# Patient Record
Sex: Male | Born: 1960 | Race: Black or African American | Hispanic: No | Marital: Single | Smoking: Current every day smoker
Health system: Southern US, Community
[De-identification: ages and names within clinical notes are randomized; demographics above are authoritative.]

## PROBLEM LIST (undated history)

## (undated) DIAGNOSIS — M7730 Calcaneal spur, unspecified foot: Secondary | ICD-10-CM

## (undated) DIAGNOSIS — F32A Depression, unspecified: Secondary | ICD-10-CM

## (undated) DIAGNOSIS — M549 Dorsalgia, unspecified: Secondary | ICD-10-CM

## (undated) DIAGNOSIS — F319 Bipolar disorder, unspecified: Secondary | ICD-10-CM

## (undated) DIAGNOSIS — F329 Major depressive disorder, single episode, unspecified: Secondary | ICD-10-CM

## (undated) DIAGNOSIS — S83519A Sprain of anterior cruciate ligament of unspecified knee, initial encounter: Secondary | ICD-10-CM

## (undated) DIAGNOSIS — M1990 Unspecified osteoarthritis, unspecified site: Secondary | ICD-10-CM

## (undated) HISTORY — DX: Major depressive disorder, single episode, unspecified: F32.9

## (undated) HISTORY — PX: OTHER SURGICAL HISTORY: SHX169

## (undated) HISTORY — DX: Calcaneal spur, unspecified foot: M77.30

## (undated) HISTORY — DX: Sprain of anterior cruciate ligament of unspecified knee, initial encounter: S83.519A

## (undated) HISTORY — DX: Bipolar disorder, unspecified: F31.9

## (undated) HISTORY — DX: Depression, unspecified: F32.A

---

## 1999-10-18 ENCOUNTER — Encounter: Payer: Self-pay | Admitting: Neurosurgery

## 1999-10-18 ENCOUNTER — Ambulatory Visit (HOSPITAL_COMMUNITY): Admission: RE | Admit: 1999-10-18 | Discharge: 1999-10-18 | Payer: Self-pay | Admitting: Neurosurgery

## 2001-06-12 ENCOUNTER — Ambulatory Visit (HOSPITAL_COMMUNITY): Admission: RE | Admit: 2001-06-12 | Discharge: 2001-06-12 | Payer: Self-pay | Admitting: Pulmonary Disease

## 2001-06-12 ENCOUNTER — Encounter: Payer: Self-pay | Admitting: Unknown Physician Specialty

## 2002-06-27 ENCOUNTER — Emergency Department (HOSPITAL_COMMUNITY): Admission: EM | Admit: 2002-06-27 | Discharge: 2002-06-27 | Payer: Self-pay | Admitting: Emergency Medicine

## 2002-07-14 ENCOUNTER — Encounter: Payer: Self-pay | Admitting: Emergency Medicine

## 2002-07-14 ENCOUNTER — Emergency Department (HOSPITAL_COMMUNITY): Admission: EM | Admit: 2002-07-14 | Discharge: 2002-07-14 | Payer: Self-pay | Admitting: Emergency Medicine

## 2002-07-19 ENCOUNTER — Emergency Department (HOSPITAL_COMMUNITY): Admission: EM | Admit: 2002-07-19 | Discharge: 2002-07-19 | Payer: Self-pay | Admitting: *Deleted

## 2002-07-19 ENCOUNTER — Encounter: Payer: Self-pay | Admitting: *Deleted

## 2002-11-17 ENCOUNTER — Emergency Department (HOSPITAL_COMMUNITY): Admission: EM | Admit: 2002-11-17 | Discharge: 2002-11-17 | Payer: Self-pay | Admitting: Emergency Medicine

## 2003-03-18 ENCOUNTER — Emergency Department (HOSPITAL_COMMUNITY): Admission: EM | Admit: 2003-03-18 | Discharge: 2003-03-18 | Payer: Self-pay | Admitting: Emergency Medicine

## 2003-08-05 ENCOUNTER — Emergency Department (HOSPITAL_COMMUNITY): Admission: EM | Admit: 2003-08-05 | Discharge: 2003-08-05 | Payer: Self-pay | Admitting: Emergency Medicine

## 2003-09-23 ENCOUNTER — Emergency Department (HOSPITAL_COMMUNITY): Admission: EM | Admit: 2003-09-23 | Discharge: 2003-09-23 | Payer: Self-pay | Admitting: Emergency Medicine

## 2004-03-11 HISTORY — PX: BACK SURGERY: SHX140

## 2004-07-26 ENCOUNTER — Ambulatory Visit (HOSPITAL_COMMUNITY): Admission: RE | Admit: 2004-07-26 | Discharge: 2004-07-26 | Payer: Self-pay | Admitting: Neurosurgery

## 2004-10-03 ENCOUNTER — Encounter: Admission: RE | Admit: 2004-10-03 | Discharge: 2004-10-03 | Payer: Self-pay | Admitting: Neurosurgery

## 2004-10-24 ENCOUNTER — Encounter: Admission: RE | Admit: 2004-10-24 | Discharge: 2004-10-24 | Payer: Self-pay | Admitting: Neurosurgery

## 2004-11-29 ENCOUNTER — Ambulatory Visit (HOSPITAL_COMMUNITY): Admission: RE | Admit: 2004-11-29 | Discharge: 2004-11-29 | Payer: Self-pay | Admitting: Neurosurgery

## 2004-12-18 ENCOUNTER — Ambulatory Visit: Admission: RE | Admit: 2004-12-18 | Discharge: 2004-12-18 | Payer: Self-pay | Admitting: Neurosurgery

## 2005-01-04 ENCOUNTER — Inpatient Hospital Stay (HOSPITAL_COMMUNITY): Admission: RE | Admit: 2005-01-04 | Discharge: 2005-01-07 | Payer: Self-pay | Admitting: Neurosurgery

## 2005-03-19 ENCOUNTER — Encounter (HOSPITAL_COMMUNITY): Admission: RE | Admit: 2005-03-19 | Discharge: 2005-04-18 | Payer: Self-pay | Admitting: Neurosurgery

## 2005-04-19 ENCOUNTER — Emergency Department (HOSPITAL_COMMUNITY): Admission: EM | Admit: 2005-04-19 | Discharge: 2005-04-19 | Payer: Self-pay | Admitting: Emergency Medicine

## 2005-05-28 ENCOUNTER — Emergency Department (HOSPITAL_COMMUNITY): Admission: EM | Admit: 2005-05-28 | Discharge: 2005-05-28 | Payer: Self-pay | Admitting: Emergency Medicine

## 2005-08-05 ENCOUNTER — Emergency Department (HOSPITAL_COMMUNITY): Admission: EM | Admit: 2005-08-05 | Discharge: 2005-08-05 | Payer: Self-pay | Admitting: Emergency Medicine

## 2006-01-17 ENCOUNTER — Emergency Department (HOSPITAL_COMMUNITY): Admission: EM | Admit: 2006-01-17 | Discharge: 2006-01-17 | Payer: Self-pay | Admitting: Emergency Medicine

## 2006-06-09 ENCOUNTER — Emergency Department (HOSPITAL_COMMUNITY): Admission: EM | Admit: 2006-06-09 | Discharge: 2006-06-09 | Payer: Self-pay | Admitting: Emergency Medicine

## 2006-08-28 ENCOUNTER — Ambulatory Visit: Payer: Self-pay | Admitting: Orthopedic Surgery

## 2006-09-16 IMAGING — CR DG LUMBAR SPINE 2-3V
2 series · 2 of 2 positions shown · non-contrast
Comparison: AP and lateral views obtained on the same date.

CLINICAL DATA: Low back pain.

LUMBAR SPINE - 2 VIEW

[view not recorded (1 of 2)]
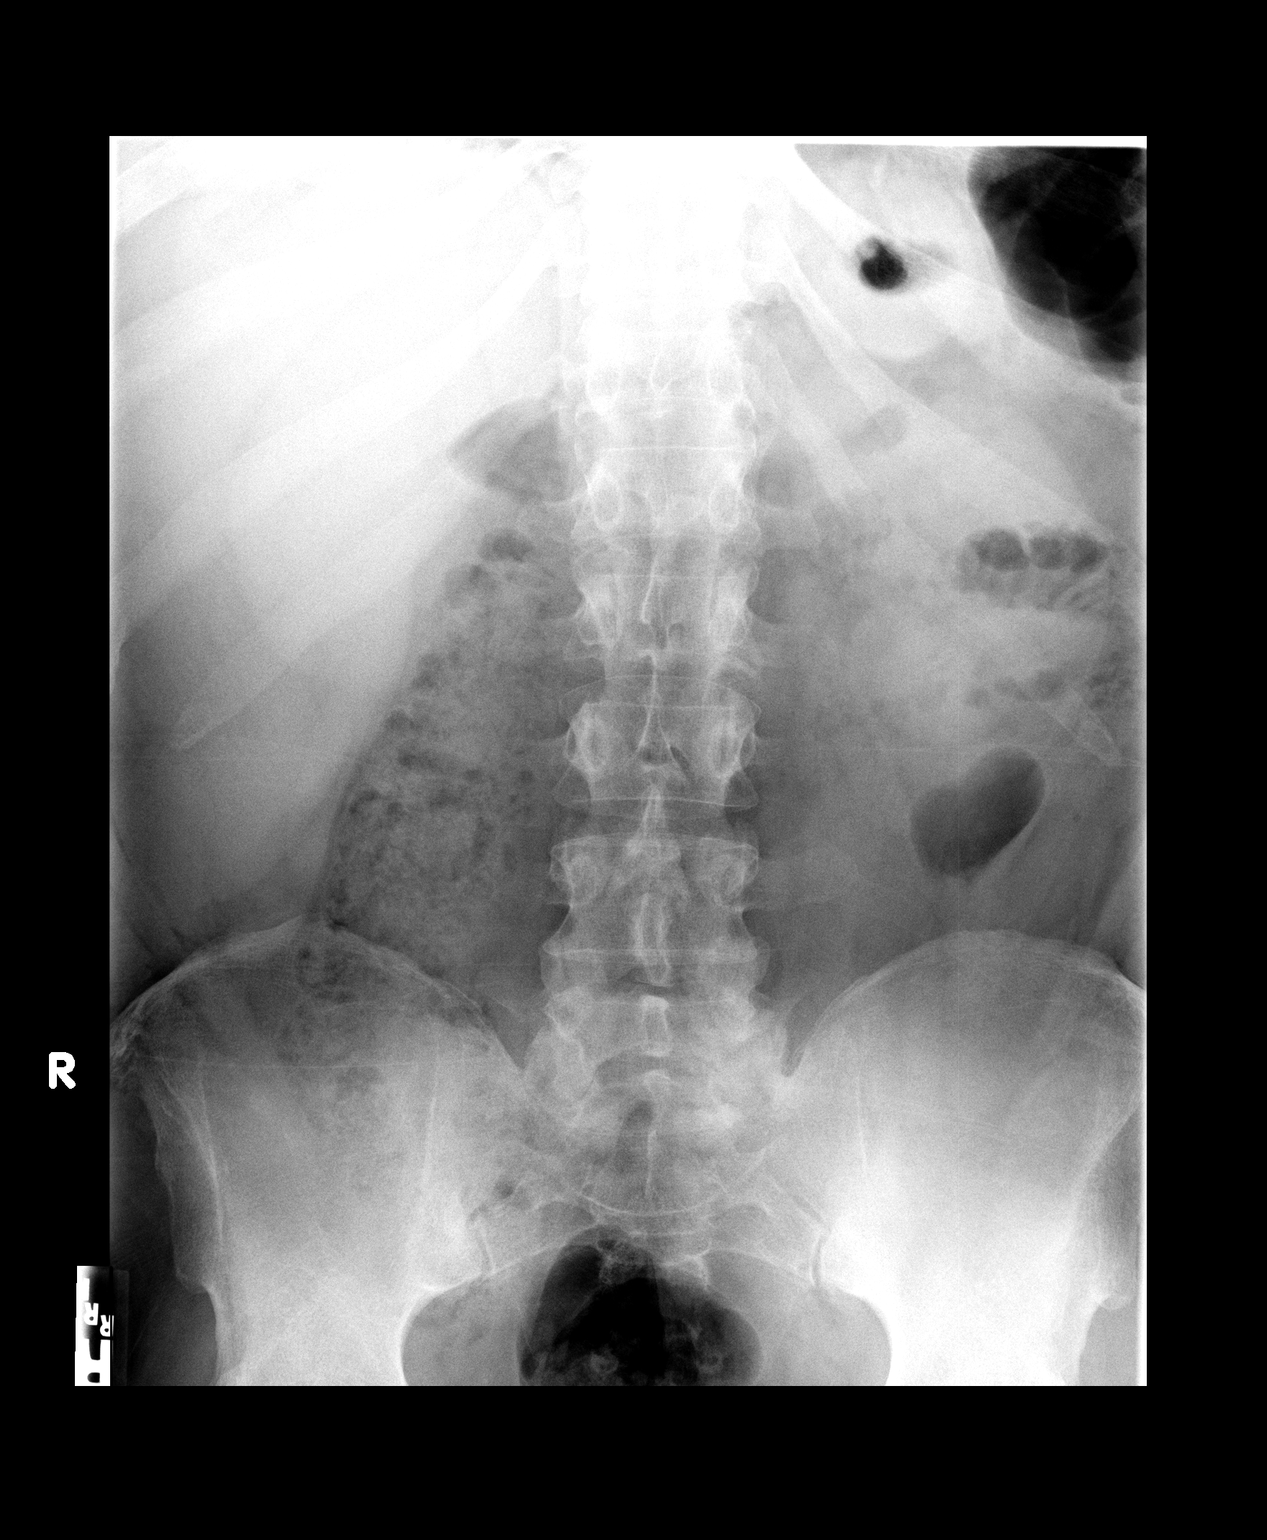

[view not recorded (2 of 2)]
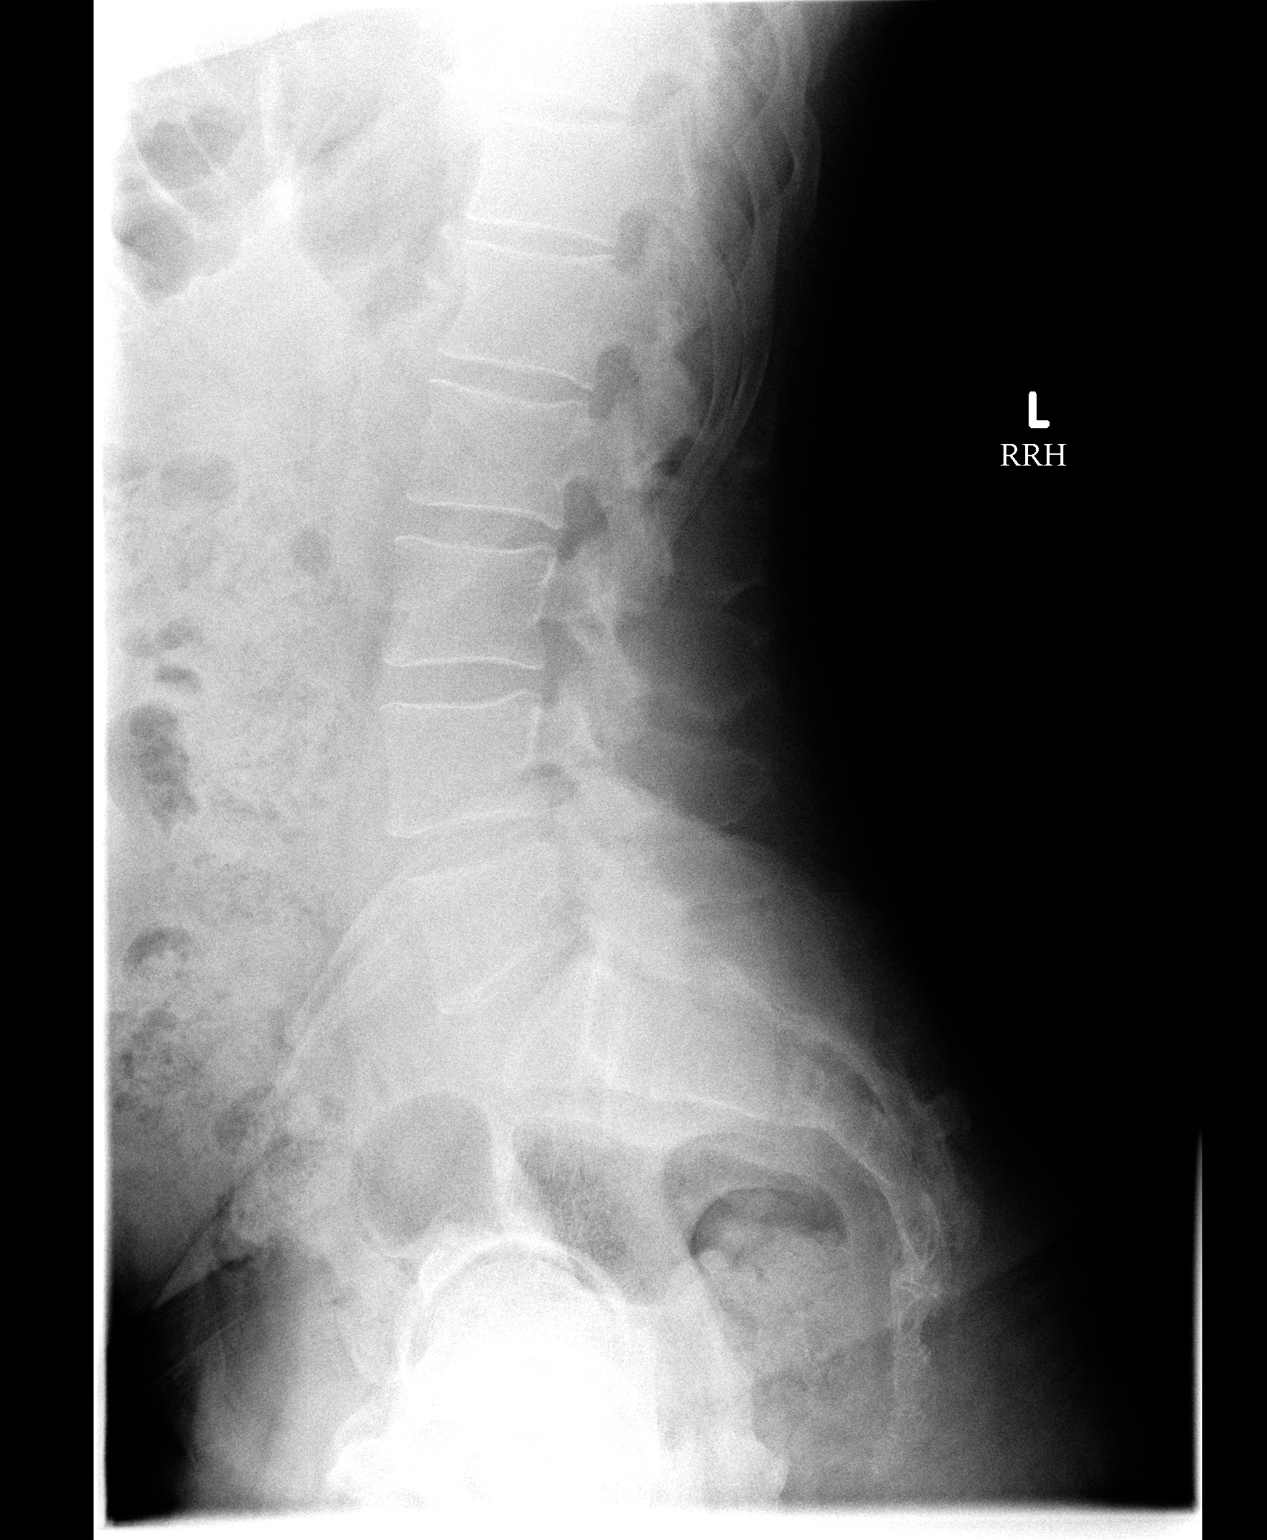

[2 of 2 positions shown; findings below may reference images not displayed]

FINDINGS: AP and lateral views of the lumbar spine demonstrate 5 nonrib-bearing
lumbar vertebrae. No disc space narrowing, spur formation, pars defects or
subluxations seen.

IMPRESSION

Normal examination.

LUMBAR SPINE - FLEXION AND EXTENSION LATERAL VIEWS
FINDINGS: Stable normal appearing lumbar spine. No subluxation with flexion or
extension.

IMPRESSION

No subluxation with flexion or extension.

## 2006-11-21 ENCOUNTER — Encounter: Admission: RE | Admit: 2006-11-21 | Discharge: 2006-11-21 | Payer: Self-pay | Admitting: Neurosurgery

## 2007-03-06 ENCOUNTER — Emergency Department (HOSPITAL_COMMUNITY): Admission: EM | Admit: 2007-03-06 | Discharge: 2007-03-06 | Payer: Self-pay | Admitting: Emergency Medicine

## 2007-03-29 ENCOUNTER — Encounter: Admission: RE | Admit: 2007-03-29 | Discharge: 2007-03-29 | Payer: Self-pay | Admitting: Neurosurgery

## 2007-12-24 ENCOUNTER — Emergency Department (HOSPITAL_COMMUNITY): Admission: EM | Admit: 2007-12-24 | Discharge: 2007-12-24 | Payer: Self-pay | Admitting: Emergency Medicine

## 2008-04-14 ENCOUNTER — Ambulatory Visit: Payer: Self-pay | Admitting: Surgery

## 2010-03-11 DIAGNOSIS — S83519A Sprain of anterior cruciate ligament of unspecified knee, initial encounter: Secondary | ICD-10-CM

## 2010-03-11 HISTORY — DX: Sprain of anterior cruciate ligament of unspecified knee, initial encounter: S83.519A

## 2010-04-01 ENCOUNTER — Encounter: Payer: Self-pay | Admitting: Neurosurgery

## 2010-07-24 NOTE — Procedures (Signed)
LOWER EXTREMITY ARTERIAL EVALUATION-SINGLE LEVEL   INDICATION:  Bilateral lower extremity pain.   HISTORY:  Diabetes:  No.  Cardiac:  No.  Hypertension:  No.  Smoking:  Half pack per day.  Previous Surgery:  History of back surgery and bilateral heel spurs.   RESTING SYSTOLIC PRESSURES: (ABI)                          RIGHT                LEFT  Brachial:               130                  124  Anterior tibial:        136 (>1.0)           128 (>1.0)  Posterior tibial:       120                  124  Peroneal:  DOPPLER WAVEFORM ANALYSIS:  Anterior tibial:        Triphasic            Triphasic  Posterior tibial:       Triphasic            Triphasic  Peroneal:   PREVIOUS ABI'S:  Date:  RIGHT:  LEFT:   IMPRESSION:  ABIs and Doppler waveforms suggest no significant arterial  occlusive disease bilaterally.   ___________________________________________  V. Charlena Cross, MD   MC/MEDQ  D:  04/14/2008  T:  04/14/2008  Job:  [id number]

## 2010-07-27 NOTE — H&P (Signed)
Ronald Cross, Ronald Cross               ACCOUNT NO.:  [id number]   MEDICAL RECORD NO.:  [id number]          PATIENT TYPE:  INP   LOCATION:  2899                         FACILITY:  [facility]   PHYSICIAN:  Payton Doughty, M.D.      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   HISTORY:  This is a 50 year old, left-handed black gentleman who has back  pain and has epidural lipomatosis.  He has pain in his back, radiating down  both legs and pain up into his mid-back.  He has severe spondylosis at L4-5  and L5-S1, and is now admitted for a fusion.   PAST MEDICAL HISTORY:  Unremarkable.   MEDICATIONS:  Tylox.   PAST SURGICAL HISTORY:  None.   ALLERGIES:  No known drug allergies.   SOCIAL HISTORY:  Smokes 1/2 pack of cigarettes daily.  Drinks alcohol on a  social basis.  He is currently out of work.  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   FAMILY HISTORY:  Mom deceased.  Dad is age 72.   REVIEW OF SYSTEMS:  Remarkable for leg weakness, back pain, leg pain,  arthritis and neck pain.   PHYSICAL EXAMINATION:  HEENT:  Within normal limits.  NECK:  He has a reasonable range of motion of his neck.  CHEST:  Clear.  HEART:  A regular rate and rhythm.  ABDOMEN:  Nontender.  No hepatosplenomegaly.  EXTREMITIES:  Without clubbing or cyanosis.  GENITOURINARY:  Deferred.  EXTREMITIES:  Peripheral pulses are good.  NEUROLOGIC:  He is awake, alert and oriented.  His cranial nerves are  intact.  Motor exam shows 5/5 strength throughout the upper and lower  extremities.  Reflexes 2 at the knees, 1 at the right ankle, absent at the  left.  Straight leg raise is positive for back pain.   MR and plain films demonstrate severe spondylosis at L4-5 and L5-S1.  A CT  scanning demonstrates that the joints are significantly  degenerated.   CLINICAL IMPRESSION:  Lumbar spondylosis and disabling back pain.   PLAN:  A lumbar laminectomy, diskectomy, posterior lumbar inter-body fusion  with Ray threaded fusion cages and pedicle screws at L4-5 and L5-S1.  The  risks and benefits of this approach have been discussed with him and he  wishes to proceed.           ______________________________  Payton Doughty, M.D.     MWR/MEDQ  D:  01/04/2005  T:  01/04/2005  Job:  [id number]

## 2010-07-27 NOTE — Op Note (Signed)
Ronald Cross, Ronald Cross               ACCOUNT NO.:  [id number]   MEDICAL RECORD NO.:  [id number]          PATIENT TYPE:  INP   LOCATION:  3015                         FACILITY:  [facility]   PHYSICIAN:  Payton Doughty, M.D.      DATE OF BIRTH:  [birth year]   DATE OF PROCEDURE:  01/04/2005  DATE OF DISCHARGE:                                 OPERATIVE REPORT   PREOPERATIVE DIAGNOSIS:  Spondylosis L4-5, L5-S1.   POSTOPERATIVE DIAGNOSIS:  Spondylosis L4-5, L5-S1.   PROCEDURE:  L4-5, L5-S1 in situ, intertransverse laminar fusion with BNP.   SURGEON:  Payton Doughty, M.D.   ANESTHESIA:  General endotracheal.   PREPARATION:  Alcohol wipe.   COMPLICATIONS:  None.   NURSE ASSISTANT:  [place].   DOCTOR ASSISTANT:  Coletta Memos, M.D.   BODY OF TEXT:  This is a 50 year old right-handed black gentleman with  lumbar spondylosis.  He has fairly normal looking disk spaces.  He was taken  to the operating room, smoothly anesthetized, intubated, and placed prone on  the operating table.  Following shave, prep and draped in the usual sterile  fashion; the skin was incised from the mid-L3 to the mid-S1 and the lamina  and transverse processes of L4, L5, and the sacral area were exposed  bilaterally in the subperiosteal plane.  The facet joints were carefully  inspected.  They were ankylosed.  There was no instability, no movement  between them.  It was felt that to take these down would generate  instability and it made no sense to do that as the chief complaint was back  pain.   Therefore, the lamina and transverse processes were decorticated with the  high speed drill and BNP placed across them on the extender matrix.   Prior to this the incision had been irrigated and hemostasis assured.  The  fascia and subcutaneous tissues were reapproximated with 2-0 Vicryl in  interrupted fashion.  The subcuticular tissues were reapproximated with 3-0  Vicryl in interrupted fashion.  Skin was closed with 3-0  Nylon in a running  lock fashion.  Betadine and telfa dressings were applied; and the patient  returned to the recovery room in good condition.          ______________________________  Payton Doughty, M.D.    MWR/MEDQ  D:  01/04/2005  T:  01/05/2005  Job:  [id number]

## 2010-07-27 NOTE — Discharge Summary (Signed)
NAMEDONTAY, HARM               ACCOUNT NO.:  [id number]   MEDICAL RECORD NO.:  [id number]          PATIENT TYPE:  INP   LOCATION:  3015                         FACILITY:  [facility]   PHYSICIAN:  Coletta Memos, M.D.     DATE OF BIRTH:  March 25, 1960   DATE OF ADMISSION:  01/04/2005  DATE OF DISCHARGE:  01/07/2005                                 DISCHARGE SUMMARY   ADMITTING DIAGNOSES:  1.  Lumbar instability L4-5, L5-S1.  2.  Spondylosis L4-5, L5-S1.   DISCHARGE DIAGNOSES:  1.  Lumbar instability L4-5, L5-S1.  2.  Spondylosis L4-5, L5-S1.   PROCEDURE:  In situ fusion L4-5, L5-S1 in a posterolateral fashion using  BNP.   COMPLICATIONS:  None.   DISCHARGE STATUS:  Alive and well.  Wound clean, dry, with no signs of  infection.  At discharge the patient is alert, oriented x 4, moving all  extremities well.  The wound is clean and dry.  He will be discharged home  with Percocet and Flexeril for pain and muscle spasms.  Given instructions  for no driving. He will call for an appointment with Dr. Channing Mutters in  approximately 3-4 weeks.           ______________________________  Coletta Memos, M.D.     KC/MEDQ  D:  01/07/2005  T:  01/07/2005  Job:  [id number]

## 2011-02-12 ENCOUNTER — Emergency Department (HOSPITAL_COMMUNITY): Payer: Medicare Other

## 2011-02-12 ENCOUNTER — Encounter: Payer: Self-pay | Admitting: *Deleted

## 2011-02-12 ENCOUNTER — Emergency Department (HOSPITAL_COMMUNITY)
Admission: EM | Admit: 2011-02-12 | Discharge: 2011-02-12 | Disposition: A | Discharge status: Home / Self Care | Payer: Medicare Other | Attending: Emergency Medicine | Admitting: Emergency Medicine

## 2011-02-12 DIAGNOSIS — F17200 Nicotine dependence, unspecified, uncomplicated: Secondary | ICD-10-CM | POA: Insufficient documentation

## 2011-02-12 DIAGNOSIS — M25569 Pain in unspecified knee: Secondary | ICD-10-CM | POA: Insufficient documentation

## 2011-02-12 HISTORY — DX: Dorsalgia, unspecified: M54.9

## 2011-02-12 MED ORDER — IBUPROFEN 800 MG PO TABS: 800.0000 mg | ORAL_TABLET | Freq: Three times a day (TID) | ORAL | Status: AC

## 2011-02-12 MED ORDER — IBUPROFEN 800 MG PO TABS
800.0000 mg | ORAL_TABLET | Freq: Once | ORAL | Status: AC
  Administered 2011-02-12: 800 mg via ORAL
  Filled 2011-02-12: qty 1

## 2011-02-12 NOTE — ED Notes (Signed)
C/o right knee pain x 2 weeks; denies injury

## 2011-02-12 NOTE — ED Provider Notes (Signed)
Medical screening examination/treatment/procedure(s) were performed by non-physician practitioner and as supervising physician I was immediately available for consultation/collaboration.   Benny Lennert, MD 02/12/11 [phone]

## 2011-02-12 NOTE — ED Provider Notes (Signed)
History     CSN: [id number] Arrival date & time: 02/12/2011  3:45 PM   First MD Initiated Contact with Patient 02/12/11 1613      Chief Complaint  Patient presents with  . Knee Pain    right    (Consider location/radiation/quality/duration/timing/severity/associated sxs/prior treatment) HPI Comments: Pt was ascending a set of wet stairs, slipped and twisted R knee.  Denies other injuries. Knee has become acutely more painful in past 2 weeks.  Patient is a 50 y.o. male presenting with knee pain. The history is provided by the patient. No language interpreter was used.  Knee Pain This is a new problem. Episode onset: 4 weeks ago. The problem occurs constantly. The problem has been unchanged. The symptoms are aggravated by walking, standing and twisting. He has tried nothing for the symptoms.    Past Medical History  Diagnosis Date  . Back pain     Past Surgical History  Procedure Date  . Back surgery     No family history on file.  History  Substance Use Topics  . Smoking status: Current Everyday Smoker -- 0.5 packs/day  . Smokeless tobacco: Not on file  . Alcohol Use: Yes     once a week      Review of Systems  Musculoskeletal:       Knee injury  All other systems reviewed and are negative.    Allergies  Review of patient's allergies indicates no known allergies.  Home Medications   Current Outpatient Rx  Name Route Sig Dispense Refill  . ALPRAZOLAM 1 MG PO TABS Oral Take 1 mg by mouth 3 (three) times daily.      Marland Kitchen NAPROXEN SODIUM 220 MG PO TABS Oral Take 440 mg by mouth at bedtime as needed. For pain     . OXYCODONE-ACETAMINOPHEN 5-500 MG PO CAPS Oral Take 1 capsule by mouth 3 (three) times daily.      . TRAZODONE HCL 50 MG PO TABS Oral Take 50 mg by mouth at bedtime.        BP 163/93  Pulse 86  Temp 98.4 F (36.9 C)  Resp 20  Ht 5\' 8"  (1.727 m)  Wt 205 lb (92.987 kg)  BMI 31.17 kg/m2  SpO2 100%  Physical Exam  Nursing note and vitals  reviewed. Constitutional: He is oriented to person, place, and time. He appears well-developed and well-nourished.  HENT:  Head: Normocephalic and atraumatic.  Eyes: EOM are normal.  Neck: Normal range of motion.  Cardiovascular: Normal rate, regular rhythm, normal heart sounds and intact distal pulses.   Pulmonary/Chest: Effort normal and breath sounds normal. No respiratory distress.  Abdominal: Soft. He exhibits no distension. There is no tenderness.  Musculoskeletal: He exhibits tenderness.       Right knee: He exhibits decreased range of motion and swelling. He exhibits no effusion, no deformity, no laceration, no erythema, no LCL laxity, normal patellar mobility, no bony tenderness, normal meniscus and no MCL laxity. tenderness found. Lateral joint line and LCL tenderness noted. No patellar tendon tenderness noted.       Legs: Neurological: He is alert and oriented to person, place, and time.  Skin: Skin is warm and dry.  Psychiatric: He has a normal mood and affect. Judgment normal.    ED Course  Procedures (including critical care time)  Labs Reviewed - No data to display Dg Knee Complete 4 Views Right  02/12/2011  *RADIOLOGY REPORT*  Clinical Data: Knee pain  RIGHT KNEE - COMPLETE  4+ VIEW  Comparison: None.  Findings: There is no evidence of fracture or dislocation. There is mild medial joint compartment narrowing and subchondral sclerosis. There is no evidence of gross suprapatellar effusion.  IMPRESSION: There is mild medial joint compartment narrowing and subchondral sclerosis.  No acute abnormality.  Original Report Authenticated By: Brandon Melnick, M.D.     No diagnosis found.    MDM         Worthy Rancher, PA 02/12/11 [phone]

## 2011-03-26 ENCOUNTER — Encounter: Payer: Self-pay | Admitting: Family Medicine

## 2011-03-26 ENCOUNTER — Ambulatory Visit (INDEPENDENT_AMBULATORY_CARE_PROVIDER_SITE_OTHER): Payer: Medicare Other | Admitting: Family Medicine

## 2011-03-26 VITALS — BP 134/76 | HR 106 | Resp 18 | Ht 67.0 in | Wt 201.0 lb

## 2011-03-26 DIAGNOSIS — G8929 Other chronic pain: Secondary | ICD-10-CM

## 2011-03-26 DIAGNOSIS — M25569 Pain in unspecified knee: Secondary | ICD-10-CM

## 2011-03-26 DIAGNOSIS — F319 Bipolar disorder, unspecified: Secondary | ICD-10-CM

## 2011-03-26 DIAGNOSIS — F17200 Nicotine dependence, unspecified, uncomplicated: Secondary | ICD-10-CM

## 2011-03-26 DIAGNOSIS — E669 Obesity, unspecified: Secondary | ICD-10-CM

## 2011-03-26 DIAGNOSIS — Z125 Encounter for screening for malignant neoplasm of prostate: Secondary | ICD-10-CM

## 2011-03-26 DIAGNOSIS — M549 Dorsalgia, unspecified: Secondary | ICD-10-CM

## 2011-03-26 DIAGNOSIS — Z720 Tobacco use: Secondary | ICD-10-CM

## 2011-03-26 DIAGNOSIS — R0789 Other chest pain: Secondary | ICD-10-CM

## 2011-03-26 NOTE — Patient Instructions (Addendum)
Continue to watch your weight- I advise fruits and vegetables with each meal. Exercise as much as tolerated Please get your cholesterol panel prior to the next visit. Please have labs strong fasting If he had severe chest pain associated with difficulty breathing please go to the ER I will get to records from [facility] and Dr. Terrilee Croak as well as Dr. Juanetta Gosling. Followup in 6 weeks

## 2011-03-26 NOTE — Progress Notes (Signed)
  Subjective:    Patient ID: Ronald Cross, male    DOB: 1960-07-29, 51 y.o.   MRN: [id number]  HPI Pt here to establish care  Previous PCP- Dr. Juanetta Gosling   Right knee pain- tore his lateral meniscus  approx 4 months ago , Dr. Chaney Malling is planning for surgery on knee, has appt on 29th.    Reviewed MRI- fracture of lateral femoral condyle and Bakers cyst as well     Walks with cane secondary to spurs in both heels as an adolescent      Chronic back pain- spine surgery 2006- had fusion performed, has been on chronic pain medication since then, currently on Tylox three times a day    Insomnia- currently on trazadone, uses this three times a week on average , has been on trazadone for past 5 years   Dr. Channing Mutters- Neurosurgeon - seen every 3 months  [facility]- Dr. Stevie Kern-- Pt since 1999, history of Depression,    Chest pain- has occ chest pain, this AM awoke with discomfort in his left chest, non radiating, no diaphoresis , no nausea. He thinks it may have been secondary to his exercises- he tries to do a lot with his upper ext. No history of HTN, DM, Hyperlipidemia      Review of Systems    GEN- denies fatigue, fever, weight loss,weakness, recent illness HEENT- denies eye drainage, change in vision, nasal discharge, CVS-  + chest pain, palpitations RESP- occd SOB, cough, denies wheeze ABD- denies N/V, change in stools, abd pain GU- denies dysuria, hematuria, dribbling, incontinence MSK- + joint pain, muscle aches,+ injury Neuro- denies headache, dizziness, syncope, seizure activity      Objective:   Physical Exam GEN- NAD, alert and oriented x3 HEENT- PERRL, EOMI, non injected sclera, pink conjunctiva, MMM, oropharynx clear, upper dentures Neck- Supple, no thryomegaly, no carotid bruit CVS- RRR, no murmur RESP-CTAB EXT- No edema Right knee- +effusion- TTP over medial and lateral aspects of knee Pulses- Radial, DP- 2+ Psych- not depressed or anxious appearing, normal speech   EKG-  NSR, no ST changes, LVH, QT 402     Assessment & Plan:

## 2011-03-27 ENCOUNTER — Encounter: Payer: Self-pay | Admitting: Family Medicine

## 2011-03-27 NOTE — Assessment & Plan Note (Signed)
I will attempt to obtain notes from his psychiatrist at [facility]. This has been very difficult in the past. He is currently not on any specific bipolar medications

## 2011-03-27 NOTE — Assessment & Plan Note (Signed)
Check fasting lipid panel 

## 2011-03-27 NOTE — Assessment & Plan Note (Signed)
Patient on pain contract with his neurosurgeon.

## 2011-03-27 NOTE — Assessment & Plan Note (Signed)
The patient has atypical chest pain. Normal EKG. Isn't evidence of possible hypertension with some LVH. He has no specific risk factors. Will prescribe thyroid lipid panel. His more recent labs will be obtained from [facility].

## 2011-03-27 NOTE — Assessment & Plan Note (Signed)
   Patient counseled on importance of cessation 

## 2011-03-27 NOTE — Assessment & Plan Note (Signed)
Patient to followup with orthopedic surgery. MRI was reviewed. Notes will be obtained

## 2011-05-06 ENCOUNTER — Ambulatory Visit (HOSPITAL_COMMUNITY)
Admission: RE | Admit: 2011-05-06 | Discharge: 2011-05-06 | Disposition: A | Discharge status: Home / Self Care | Payer: Medicare Other | Source: Ambulatory Visit | Attending: Orthopedic Surgery | Admitting: Orthopedic Surgery

## 2011-05-06 DIAGNOSIS — IMO0001 Reserved for inherently not codable concepts without codable children: Secondary | ICD-10-CM | POA: Insufficient documentation

## 2011-05-06 DIAGNOSIS — M25669 Stiffness of unspecified knee, not elsewhere classified: Secondary | ICD-10-CM | POA: Insufficient documentation

## 2011-05-06 DIAGNOSIS — M6281 Muscle weakness (generalized): Secondary | ICD-10-CM | POA: Insufficient documentation

## 2011-05-06 DIAGNOSIS — M25569 Pain in unspecified knee: Secondary | ICD-10-CM | POA: Insufficient documentation

## 2011-05-06 DIAGNOSIS — R262 Difficulty in walking, not elsewhere classified: Secondary | ICD-10-CM | POA: Insufficient documentation

## 2011-05-06 NOTE — Evaluation (Signed)
Physical Therapy Evaluation  Patient Details  Name: Ronald Cross MRN: [id number] Date of Birth: January 24, 1961  Today's Date: 05/06/2011 Time: 1550-1610 Time Calculation (min): 20 min Charges: 1 eval Visit#: 1  of 8   Re-eval: 06/05/11 Assessment Diagnosis: Rt knee scope Surgical Date: 04/23/11 Next MD Visit: 05/22/11 Prior Therapy: For spine surgery  Past Medical History:  Past Medical History  Diagnosis Date  . Back pain   . Heel spur Since Age 51    Pt walks with cane or crutches since age 40  . Torn ACL 2012  . Bipolar disorder   . Depression    Past Surgical History:  Past Surgical History  Procedure Date  . Back surgery 2006    Fusion- Ronald Cross    Subjective Symptoms/Limitations Symptoms: Pt is a 51 year old male referred to PT s/p 04/23/11.  he reports that he tore his meniscus while going down a slope in June.  He comes in today with bilateral axillary crutches and states he is in severe. Taking pain medication for pain and to decrease his anxiety after the surgery.   Prior to surgery he ambulated with a SPC secondary to weakness from back surgery.  His c/co is increased pain to his knee. How long can you sit comfortably?: 5 minutes  How long can you stand comfortably?: Will not report a time and states that he can only do what he can do, and continues to have increased pain.  However when asked to stand to perform activities he cannot stand for more then 2 minutes without shifiting weight.  How long can you walk comfortably?: Unable to accuratly report and states that he has significant pain just walking in today from a very short distance.  Paces the room during the exam for 5 minutes with bilateral axillary crutches.  Pain Assessment Currently in Pain?: Yes Pain Score: 10-Worst pain ever Pain Location: Knee Pain Orientation: Right Pain Type: Acute pain  Prior Functioning  Home Living Lives With: Alone Receives Help From: Home health Prior Function Driving:  No Leisure: Hobbies-yes (Comment) Comments: Enjoys reading and writing, watching basketball, enjoys cooking   Cognition/Observation Observation/Other Assessments Observations: Pt aggitated during the exam.  He asked to stop our conversation of the history and proceed with testing secondary to increased pain, hunger and he was unable to concentrate on anything else.    Assessment RLE AROM (degrees) RLE Overall AROM Comments: Extension - Flexion: 15-75 taken with patient sitting on EOB RLE Strength RLE Overall Strength Comments: Overall 3/5 - pt with increased anxiety during examination and only able to sit secondary to pain and increased anxiety.  Palpation Palpation: Significant increase in pain with gentle palpation to R: medial, anterior and lateral knee.   Mobility/Balance  Ambulation/Gait Ambulation/Gait: Yes Ambulation/Gait Assistance: 6: Modified independent (Device/Increase time) Assistive device: Crutches Gait Pattern: Decreased hip/knee flexion - right;Decreased stance time - right Gait velocity: decreased   Exercise/Treatments Education on Exercises only   Physical Therapy Assessment and Plan PT Assessment and Plan Clinical Impression Statement: Pt is a 51 year old male referred to PT s/p R knee scope on 04/23/11.  During the examination the patient was notably aggitated and had increased anxiety secondary to increased pain.  After the examination it was found that he has current impairments including increased pain, decreased R knee AROM, decreased strength and difficulty walking which is limiting his ability to participate in household activities.  Pt will benefit from skilled OPPT in order to address the  above impairments in order to maximize function and reach functional goals.  Rehab Potential: Fair PT Frequency: Min 2X/week PT Duration: 4 weeks PT Treatment/Interventions: DME instruction;Gait training;Stair training;Functional mobility training;Therapeutic  activities;Therapeutic exercise;Balance training;Patient/family education;Other (comment) (manual for pain and ROM) PT Plan: Bike for gentle ROM, 4 way SLR if able to tolerate laying down, squats, heel raises, toe raises, gentle ROM secondary to increased anxiety.    Goals Home Exercise Program Pt will Perform Home Exercise Program: Independently PT Short Term Goals Time to Complete Short Term Goals: 4 weeks PT Short Term Goal 1: Pt will report pain less than or equal to 5/10 for 50% of of his day.  PT Short Term Goal 2: Pt will improve his LE strength by 1 muscle grade in order to tolerate standing for greater than 15 minutes in order to continue with cooking.  PT Short Term Goal 3: Pt will improve his R knee AROM to 0-110 degrees in order to ambulate independently with appropriate gait mechanics.   Problem List Patient Active Problem List  Diagnoses  . Atypical chest pain  . Obesity  . Knee pain  . Chronic back pain  . Tobacco user  . Bipolar disorder  . Difficulty in walking  . Pain in joint, lower leg  . Stiffness of joint, not elsewhere classified, lower leg    PT Plan of Care PT Home Exercise Plan: Instruction only secondary to patient unwilling to lay on the bed because of pain. Educated pt to take pain medication an hour before therapy to decrease his overall pain.  Declined to recieve ice today. Consulted and Agree with Plan of Care: Patient  GP  Functional Reporting Modifier  Current Status  [phone] - Mobility: Walking & Moving Around CL - At least 60% but less than 80% impaired, limited or restricted  Goal Status  [id number] - Mobility: [place] & Moving Around CI - At least 1% but less than 20% impaired, limited or restricted   Based on ability to ambulate with a SPC before coming into therapy.   Ronald Cross 05/06/2011, 4:28 PM  Physician Documentation Your signature is required to indicate approval of the treatment plan as stated above.  Please sign and either send  electronically or make a copy of this report for your files and return this physician signed original.   Please mark one 1.__approve of plan  2. ___approve of plan with the following conditions.   ______________________________                                                          _____________________ Physician Signature                                                                                                             Date

## 2011-05-07 ENCOUNTER — Ambulatory Visit: Payer: Medicare Other | Admitting: Family Medicine

## 2011-05-08 ENCOUNTER — Encounter: Payer: Self-pay | Admitting: Family Medicine

## 2011-05-09 ENCOUNTER — Ambulatory Visit (HOSPITAL_COMMUNITY): Payer: Medicare Other | Admitting: Physical Therapy

## 2011-05-13 ENCOUNTER — Ambulatory Visit (HOSPITAL_COMMUNITY)
Admission: RE | Admit: 2011-05-13 | Discharge: 2011-05-13 | Disposition: A | Discharge status: Home / Self Care | Payer: Medicare Other | Source: Ambulatory Visit | Attending: Family Medicine | Admitting: Family Medicine

## 2011-05-13 DIAGNOSIS — M25669 Stiffness of unspecified knee, not elsewhere classified: Secondary | ICD-10-CM | POA: Insufficient documentation

## 2011-05-13 DIAGNOSIS — M6281 Muscle weakness (generalized): Secondary | ICD-10-CM | POA: Insufficient documentation

## 2011-05-13 DIAGNOSIS — IMO0001 Reserved for inherently not codable concepts without codable children: Secondary | ICD-10-CM | POA: Insufficient documentation

## 2011-05-13 DIAGNOSIS — M25569 Pain in unspecified knee: Secondary | ICD-10-CM | POA: Insufficient documentation

## 2011-05-13 DIAGNOSIS — R262 Difficulty in walking, not elsewhere classified: Secondary | ICD-10-CM | POA: Insufficient documentation

## 2011-05-13 NOTE — Progress Notes (Signed)
Physical Therapy Treatment Patient Details  Name: Ronald Cross MRN: [id number] Date of Birth: 24-Feb-1961  Today's Date: 05/13/2011 Time: 4098-1191 Time Calculation (min): 43 min CHarges: 25' TE Visit#: 2  of 8   Re-eval: 06/05/10    Subjective: Symptoms/Limitations Symptoms: Pt comes in today with bilateral axillary crutches and reports his pain as a 10/10.  Reports that he is using a RW at home sometimes to walk around.  He also reports his pain is worse in the morning and gets better around 1230-1.  Pain Assessment Pain Score: 10-Worst pain ever Pain Location: Knee Pain Orientation: Right  Exercise/Treatments Aerobic Stationary Bike: x8' for ROM w/10 sec holds Standing Heel Raises: 10 reps;Limitations Heel Raises Limitations: Toe Raises: 10x Knee Flexion: Right;10 reps Lateral Step Up: Right;10 reps;Hand Hold: 1;Step Height: 2" Forward Step Up: Right;Hand Hold: 1;Step Height: 2";10 reps Functional Squat: 10 reps Supine Heel Slides: Right;Limitations Heel Slides Limitations: 3 minutes of slides to quad sets on slide board Straight Leg Raises: Right;10 reps Sidelying Hip ABduction: Right;10 reps Prone  Hamstring Curl: 10 reps Hip Extension: Right;10 reps  Physical Therapy Assessment and Plan PT Assessment and Plan Clinical Impression Statement: Subjective report does not meet objective findings.  Pt demonstrates increased difficulty with knee flexion, but when asked to sit in a chair, he is able to bend his knee to at least 90 degrees to sit.  he completed all ther-ex today with complaints of pain at end range.  PT Plan: Cont to increase strength and ROM as tolerated.     Problem List Patient Active Problem List  Diagnoses  . Atypical chest pain  . Obesity  . Knee pain  . Chronic back pain  . Tobacco user  . Bipolar disorder  . Difficulty in walking  . Pain in joint, lower leg  . Stiffness of joint, not elsewhere classified, lower leg   GP No functional  reporting required  Ronald Cross 05/13/2011, 12:12 PM

## 2011-05-16 ENCOUNTER — Ambulatory Visit (HOSPITAL_COMMUNITY): Payer: Medicare Other

## 2011-05-21 ENCOUNTER — Ambulatory Visit (HOSPITAL_COMMUNITY): Payer: Medicare Other | Admitting: Physical Therapy

## 2011-05-23 ENCOUNTER — Telehealth (HOSPITAL_COMMUNITY): Payer: Self-pay

## 2011-05-23 ENCOUNTER — Inpatient Hospital Stay (HOSPITAL_COMMUNITY): Admission: RE | Admit: 2011-05-23 | Payer: Medicare Other | Source: Ambulatory Visit

## 2011-05-28 ENCOUNTER — Ambulatory Visit (HOSPITAL_COMMUNITY): Payer: Medicare Other

## 2011-05-30 ENCOUNTER — Encounter: Payer: Self-pay | Admitting: Family Medicine

## 2011-05-30 ENCOUNTER — Ambulatory Visit (INDEPENDENT_AMBULATORY_CARE_PROVIDER_SITE_OTHER): Payer: Medicare Other | Admitting: Family Medicine

## 2011-05-30 ENCOUNTER — Ambulatory Visit (HOSPITAL_COMMUNITY)
Admission: RE | Admit: 2011-05-30 | Discharge: 2011-05-30 | Disposition: A | Discharge status: Home / Self Care | Payer: Medicare Other | Source: Ambulatory Visit | Attending: Family Medicine | Admitting: Family Medicine

## 2011-05-30 VITALS — BP 140/94 | HR 89 | Resp 16 | Ht 67.0 in | Wt 205.8 lb

## 2011-05-30 DIAGNOSIS — F17200 Nicotine dependence, unspecified, uncomplicated: Secondary | ICD-10-CM

## 2011-05-30 DIAGNOSIS — F319 Bipolar disorder, unspecified: Secondary | ICD-10-CM

## 2011-05-30 DIAGNOSIS — Z720 Tobacco use: Secondary | ICD-10-CM

## 2011-05-30 DIAGNOSIS — M25569 Pain in unspecified knee: Secondary | ICD-10-CM

## 2011-05-30 DIAGNOSIS — R0789 Other chest pain: Secondary | ICD-10-CM

## 2011-05-30 NOTE — Progress Notes (Signed)
Physical Therapy Treatment Patient Details  Name: Ronald Cross MRN: [id number] Date of Birth: 01/16/1961  Today's Date: 05/30/2011 Time: 1191-4782 Time Calculation (min): 43 min Visit#: 3  of 8   Re-eval: 06/05/11 Charges:  therex 35'    Subjective: Symptoms/Limitations Symptoms: Pt. ambulated into therapy today with SPC; reports his pain is 8/10 in his R knee, however doesn't appear in any distress.  Pt. was 10 minutes late for appointment. Pain Assessment Currently in Pain?: Yes Pain Score:   8 Pain Orientation: Right Pain Type: Acute pain   Exercise/Treatments Aerobic Stationary Bike: x8' for ROM w/10 sec holds Standing Heel Raises: 15 reps Heel Raises Limitations: Toe Raises: 15x Knee Flexion: 15 reps;Limitations Knee Flexion Limitations: TKF with 6"step Stairs: 1 RT reciprocally SLS: R:9", L:16" max of 3 trials Other Standing Knee Exercises: Balance beam 2RT, tandem and side stepping Other Standing Knee Exercises: Hurdles 2RT Seated Other Seated Knee Exercises: Sit to stand 5 reps without UE's     Physical Therapy Assessment and Plan PT Assessment and Plan Clinical Impression Statement: Pt. very hestitant to try activites, stating he couldn't do it before trying.  Pt. was able to complete all activities without difficulty but with encouragement.  Pt. continues to be unable to make a full revolution on the bike but able to ascend/descend stairs reciprocally without difficulty. PT Plan: Continue to progress and increase activity.  Add chair stretch to increase knee flexion next visit.     Problem List Patient Active Problem List  Diagnoses  . Atypical chest pain  . Obesity  . Knee pain  . Chronic back pain  . Tobacco user  . Bipolar disorder  . Difficulty in walking  . Pain in joint, lower leg  . Stiffness of joint, not elsewhere classified, lower leg    PT - End of Session Activity Tolerance: Patient tolerated treatment well General Behavior During  Session: [facility] for tasks performed Cognition: [facility] for tasks performed   Yuya Vanwingerden B. Bascom Levels, PTA 05/30/2011, 12:13 PM

## 2011-05-30 NOTE — Patient Instructions (Addendum)
Get your labs done - make sure you are fasting - we will call with results  Continue your current medications I recommend you quit smoking! Watch the foods - watch the salt intake, watch your fatty foods and fried foods F/U 6 months

## 2011-05-31 NOTE — Assessment & Plan Note (Signed)
Discussed importance of cessation, he is not ready to quit

## 2011-05-31 NOTE — Progress Notes (Signed)
  Subjective:    Patient ID: Ronald Cross, male    DOB: 1960-08-30, 51 y.o.   MRN: [id number]  HPI  Pt presents for follow-up, s/p repair of ACL of right knee. No change in chronic pain meds given by his neurosurgeon. He is in PT for his knee, he has been released by the orthopedic surgeon Feels well today, he did not get his labs done. He states he hit the lottery but owes a bill at [employer] that he is not aware of Seen by his psychiatrist and started back on xanax   Review of Systems  GEN- denies fatigue, fever, weight loss,weakness, recent illness, CVS- denies chest pain, palpitations RESP- denies SOB, cough, wheeze MSK- denies joint pain, muscle aches, injury       Objective:   Physical Exam  GEN- NAD, alert and oriented x3  CVS- RRR, no murmur  RESP-CTAB  EXT- No edema Knee- ACE Wrap applied to right knee      Assessment & Plan:

## 2011-05-31 NOTE — Assessment & Plan Note (Signed)
He is in therapy now, will defer to ortho if any problems arise

## 2011-05-31 NOTE — Assessment & Plan Note (Signed)
No recent episodes

## 2011-05-31 NOTE — Assessment & Plan Note (Signed)
He is being treated by psych, now on benzo, will follow along, no records have been recieved

## 2011-06-04 ENCOUNTER — Ambulatory Visit (HOSPITAL_COMMUNITY): Payer: Medicare Other | Admitting: Physical Therapy

## 2011-06-05 ENCOUNTER — Ambulatory Visit (HOSPITAL_COMMUNITY)
Admission: RE | Admit: 2011-06-05 | Discharge: 2011-06-05 | Disposition: A | Discharge status: Home / Self Care | Payer: Medicare Other | Source: Ambulatory Visit | Attending: Family Medicine | Admitting: Family Medicine

## 2011-06-05 NOTE — Progress Notes (Signed)
Physical Therapy Treatment Patient Details  Name: Ronald Cross MRN: [id number] Date of Birth: [birth year]  Today's Date: 06/05/2011 Time: 7829-5621 Time Calculation (min): 45 min Visit#: 4  of 8   Re-eval: 06/06/11 Charges:  therex 38'    Subjective: Symptoms/Limitations Symptoms: Pt. reports his pain is 9/10 upon arrival, again does not appear in enough distress to go to ED. Pain Assessment Currently in Pain?: Yes Pain Score:   9 Pain Location: Knee Pain Orientation: Right   Exercise/Treatments Aerobic Stationary Bike: schwinn, 5 holes showing 5' rocking (needs 4" step to get on bike) Standing Heel Raises: 15 reps Heel Raises Limitations: Toe Raises: 15x Knee Flexion: 15 reps;Limitations Knee Flexion Limitations: TKF with 6"step Lateral Step Up: 10 reps;Hand Hold: 1;Step Height: 2" Forward Step Up: 10 reps;Step Height: 4";Hand Hold: 1 Stairs: 1 flight, step to; unable to perform reciprocally SLS: R LE max 5" of 3 trials Seated Other Seated Knee Exercises: Sit to stand 10 reps without UE's    Physical Therapy Assessment and Plan PT Assessment and Plan Clinical Impression Statement: Pt. with continued hesitancy to attempt activities.  Pt request to try schwinn upright bike instead of recumbent bike.  Pt. was still unable to make full revolution but with good ROM performing TKF (to 90 degrees).  Pt. able to perform step ups with 4" steps but had to decrease to 2" for lateral step ups. PT Plan: Continue to progress strength, stability; progress with step ups; Add forward step downs next visit with 2" step.     Problem List Patient Active Problem List  Diagnoses  . Atypical chest pain  . Obesity  . Knee pain  . Chronic back pain  . Tobacco user  . Bipolar disorder  . Difficulty in walking  . Pain in joint, lower leg    PT - End of Session Activity Tolerance: Patient tolerated treatment well General Behavior During Session: [facility] for tasks performed Cognition:  [facility] for tasks performed   Lumi Winslett B. Bascom Levels, PTA 06/05/2011, 12:09 PM

## 2011-06-06 ENCOUNTER — Ambulatory Visit (HOSPITAL_COMMUNITY)
Admission: RE | Admit: 2011-06-06 | Discharge: 2011-06-06 | Disposition: A | Discharge status: Home / Self Care | Payer: Medicare Other | Source: Ambulatory Visit | Attending: Family Medicine | Admitting: Family Medicine

## 2011-06-10 NOTE — Evaluation (Signed)
Physical Therapy Discharge Note and Treatment  Patient Details  Name: Ronald Cross MRN: [id number] Date of Birth: May 19, 1960  Today's Date: 06/10/2011 Time: 4098-1191 Time Calculation (min): 30 min Charges: 1 ROM, 1 MMT, 15' TE, 10' Self Care Visit#: 5  of 8   Re-eval: 06/06/11    Past Medical History:  Past Medical History  Diagnosis Date  . Back pain   . Heel spur Since Age 51    Pt walks with cane or crutches since age 38  . Torn ACL 2012  . Bipolar disorder   . Depression    Past Surgical History:  Past Surgical History  Procedure Date  . Back surgery 2006    Fusion- Dr. Channing Mutters  . Right knee     ACL    Subjective Symptoms/Limitations Symptoms: From 06/06/11: Pt continues to complain of pain.  Pain Assessment Currently in Pain?: Yes Pain Score:   9 Pain Location: Knee Pain Orientation: Right  Assessment RLE AROM (degrees) RLE Overall AROM Comments: Extension 15-92 RLE Strength RLE Overall Strength Comments: Overall 4/5.  Pt reports increased pain.   Exercise/Treatments Aerobic Stationary Bike: 8' for ROM Seated Other Seated Knee Exercises: Hip adduction isometrics 10x10 sec holds; Heel/Toe Roll in and Outs 10x10 sec holds each direction Education: Reviewed his HEP by description.  Educated to continue with HEP and ROM exercises to maximize therapeutic effect.      Physical Therapy Assessment and Plan PT Assessment and Plan Clinical Impression Statement: Ronald Cross has attended 5 OPPT visits s/p knee scope.  He continues to complain of intense pain (9/10) for most of his day.  he is unable to perform a full rotation around the bike, however is able to go from stand to sit in a chair without difficulty.  His AROM: 15-92 degrees and MMT measurments where completed w.submax effort secondary to complaints of pain.  He reports that he is doing everything that he needs to do and does not have difficulty getting around.  His goals have been met and therefore pt is D/C  from OP PT.  PT Plan: D/C w/advanced HEP    Goals Home Exercise Program Pt will Perform Home Exercise Program: Independently PT Short Term Goals Time to Complete Short Term Goals: 4 weeks PT Short Term Goal 1: Pt will report pain less than or equal to 5/10 for 50% of of his day.  PT Short Term Goal 1 - Progress: Not met PT Short Term Goal 2: Pt will improve his LE strength by 1 muscle grade in order to tolerate standing for greater than 15 minutes in order to continue with cooking.  PT Short Term Goal 2 - Progress: Partly met PT Short Term Goal 3: Pt will improve his R knee AROM to 0-110 degrees in order to ambulate independently with appropriate gait mechanics.  PT Short Term Goal 3 - Progress: Not met  Problem List Patient Active Problem List  Diagnoses  . Atypical chest pain  . Obesity  . Knee pain  . Chronic back pain  . Tobacco user  . Bipolar disorder  . Difficulty in walking  . Pain in joint, lower leg   Ronald Cross 06/10/2011, 12:40 PM  Physician Documentation Your signature is required to indicate approval of the treatment plan as stated above.  Please sign and either send electronically or make a copy of this report for your files and return this physician signed original.   Please mark one 1.__approve of plan  2. ___approve of  plan with the following conditions.   ______________________________                                                          _____________________ Physician Signature                                                                                                             Date

## 2011-06-19 LAB — LIPID PANEL
HDL: 49 mg/dL (ref >39)
LDL Cholesterol: 55 mg/dL (ref 0–99)
Total CHOL/HDL Ratio: 3.1 Ratio
Triglycerides: 251 mg/dL — ABNORMAL HIGH (ref <150)
VLDL: 50 mg/dL — ABNORMAL HIGH (ref 0–40)

## 2011-06-20 LAB — PSA, MEDICARE: PSA: 1.26 ng/mL (ref <=4.00)

## 2011-07-30 ENCOUNTER — Encounter (HOSPITAL_COMMUNITY): Payer: Self-pay

## 2011-07-30 ENCOUNTER — Emergency Department (HOSPITAL_COMMUNITY)
Admission: EM | Admit: 2011-07-30 | Discharge: 2011-07-30 | Disposition: A | Discharge status: Home / Self Care | Payer: Medicare Other | Attending: Emergency Medicine | Admitting: Emergency Medicine

## 2011-07-30 DIAGNOSIS — S61219A Laceration without foreign body of unspecified finger without damage to nail, initial encounter: Secondary | ICD-10-CM

## 2011-07-30 DIAGNOSIS — F17200 Nicotine dependence, unspecified, uncomplicated: Secondary | ICD-10-CM | POA: Insufficient documentation

## 2011-07-30 DIAGNOSIS — Z79899 Other long term (current) drug therapy: Secondary | ICD-10-CM | POA: Insufficient documentation

## 2011-07-30 DIAGNOSIS — S61209A Unspecified open wound of unspecified finger without damage to nail, initial encounter: Secondary | ICD-10-CM | POA: Insufficient documentation

## 2011-07-30 DIAGNOSIS — M79609 Pain in unspecified limb: Secondary | ICD-10-CM | POA: Insufficient documentation

## 2011-07-30 DIAGNOSIS — IMO0002 Reserved for concepts with insufficient information to code with codable children: Secondary | ICD-10-CM | POA: Insufficient documentation

## 2011-07-30 DIAGNOSIS — F3130 Bipolar disorder, current episode depressed, mild or moderate severity, unspecified: Secondary | ICD-10-CM | POA: Insufficient documentation

## 2011-07-30 MED ORDER — TETANUS-DIPHTH-ACELL PERTUSSIS 5-2.5-18.5 LF-MCG/0.5 IM SUSP
INTRAMUSCULAR | Status: AC
  Administered 2011-07-30: 5 mL via INTRAMUSCULAR
  Filled 2011-07-30: qty 0.5

## 2011-07-30 NOTE — ED Notes (Signed)
Was opening a can of soup and cut right hand index finger.  Unsure of last tetanus.

## 2011-07-30 NOTE — ED Provider Notes (Signed)
History     CSN: [id number]  Arrival date & time 07/30/11  1429   None     Chief Complaint  Patient presents with  . Laceration    (Consider location/radiation/quality/duration/timing/severity/associated sxs/prior treatment) HPI Comments: Pt cut pad of R index finger on soup can lid.  Patient is a 51 y.o. male presenting with skin laceration. The history is provided by the patient. No language interpreter was used.  Laceration  The incident occurred 1 to 2 hours ago. The laceration is 1 cm in size.    Past Medical History  Diagnosis Date  . Back pain   . Heel spur Since Age 72    Pt walks with cane or crutches since age 24  . Torn ACL 2012  . Bipolar disorder   . Depression     Past Surgical History  Procedure Date  . Back surgery 2006    Fusion- Dr. Channing Mutters  . Right knee     ACL    Family History  Problem Relation Age of Onset  . COPD Mother   . Heart disease Mother   . Diabetes Father     History  Substance Use Topics  . Smoking status: Current Everyday Smoker -- 0.5 packs/day    Types: Cigarettes  . Smokeless tobacco: Not on file  . Alcohol Use: Yes     once a week      Review of Systems  Skin: Positive for wound.  Neurological: Negative for numbness.  All other systems reviewed and are negative.    Allergies  Review of patient's allergies indicates no known allergies.  Home Medications   Current Outpatient Rx  Name Route Sig Dispense Refill  . ALPRAZOLAM 1 MG PO TABS Oral Take 1 mg by mouth 3 (three) times daily.      . OXYCODONE-ACETAMINOPHEN 5-500 MG PO CAPS Oral Take 1 capsule by mouth 3 (three) times daily.      . TRAZODONE HCL 50 MG PO TABS Oral Take 50 mg by mouth at bedtime.        BP 137/99  Pulse 87  Temp(Src) 98.2 F (36.8 C) (Oral)  Resp 20  Ht 5\' 8"  (1.727 m)  Wt 200 lb (90.719 kg)  BMI 30.41 kg/m2  SpO2 100%  Physical Exam  Nursing note and vitals reviewed. Constitutional: He is oriented to person, place, and time.  He appears well-developed and well-nourished.  HENT:  Head: Normocephalic and atraumatic.  Eyes: EOM are normal.  Neck: Normal range of motion.  Cardiovascular: Normal rate, regular rhythm, normal heart sounds and intact distal pulses.   Pulmonary/Chest: Effort normal and breath sounds normal. No respiratory distress.  Abdominal: Soft. He exhibits no distension. There is no tenderness.  Musculoskeletal: Normal range of motion. He exhibits tenderness.       Right hand: He exhibits tenderness and laceration. He exhibits normal range of motion, no bony tenderness and normal capillary refill. normal sensation noted. Normal strength noted.       Hands:      1 cm linear lac to pad of R 2nd finger.  Superficial depth.  Continually oozing blood.  Neurological: He is alert and oriented to person, place, and time.  Skin: Skin is warm and dry.  Psychiatric: He has a normal mood and affect. Judgment normal.    ED Course  Procedures (including critical care time)  Labs Reviewed - No data to display No results found.   No diagnosis found.    MDM  Return  prn        Worthy Rancher, PA 07/30/11 1555

## 2011-07-30 NOTE — ED Notes (Signed)
Lac distal phalanx RIF., cut on soup can lid.  1/2".

## 2011-07-30 NOTE — Discharge Instructions (Signed)
Keep the wound clean and dry.  Return if any problems.

## 2011-07-30 NOTE — ED Provider Notes (Signed)
Medical screening examination/treatment/procedure(s) were performed by non-physician practitioner and as supervising physician I was immediately available for consultation/collaboration.   Joya Gaskins, MD 07/30/11 [phone]

## 2011-11-27 ENCOUNTER — Ambulatory Visit: Payer: Medicare Other | Admitting: Family Medicine

## 2011-11-29 ENCOUNTER — Ambulatory Visit: Payer: Medicare Other | Admitting: Family Medicine

## 2011-12-05 ENCOUNTER — Encounter: Payer: Self-pay | Admitting: Family Medicine

## 2011-12-05 ENCOUNTER — Ambulatory Visit (INDEPENDENT_AMBULATORY_CARE_PROVIDER_SITE_OTHER): Payer: Medicare Other | Admitting: Family Medicine

## 2011-12-05 ENCOUNTER — Other Ambulatory Visit: Payer: Self-pay | Admitting: Family Medicine

## 2011-12-05 VITALS — BP 144/84 | HR 97 | Resp 15 | Ht 67.0 in | Wt 206.1 lb

## 2011-12-05 DIAGNOSIS — Z720 Tobacco use: Secondary | ICD-10-CM

## 2011-12-05 DIAGNOSIS — R030 Elevated blood-pressure reading, without diagnosis of hypertension: Secondary | ICD-10-CM

## 2011-12-05 DIAGNOSIS — E669 Obesity, unspecified: Secondary | ICD-10-CM

## 2011-12-05 DIAGNOSIS — E785 Hyperlipidemia, unspecified: Secondary | ICD-10-CM

## 2011-12-05 DIAGNOSIS — F17200 Nicotine dependence, unspecified, uncomplicated: Secondary | ICD-10-CM

## 2011-12-05 DIAGNOSIS — F319 Bipolar disorder, unspecified: Secondary | ICD-10-CM

## 2011-12-05 LAB — CBC
MCH: 29.9 pg (ref 26.0–34.0)
MCV: 87.4 fL (ref 78.0–100.0)
Platelets: 287 10*3/uL (ref 150–400)
RBC: 4.75 MIL/uL (ref 4.22–5.81)
RDW: 12.7 % (ref 11.5–15.5)
WBC: 9.9 10*3/uL (ref 4.0–10.5)

## 2011-12-05 LAB — COMPREHENSIVE METABOLIC PANEL
ALT: 16 U/L (ref 0–53)
Albumin: 4.5 g/dL (ref 3.5–5.2)
CO2: 26 mEq/L (ref 19–32)
Glucose, Bld: 106 mg/dL — ABNORMAL HIGH (ref 70–99)
Potassium: 4 mEq/L (ref 3.5–5.3)
Sodium: 138 mEq/L (ref 135–145)
Total Bilirubin: 0.6 mg/dL (ref 0.3–1.2)
Total Protein: 7.5 g/dL (ref 6.0–8.3)

## 2011-12-05 LAB — LIPID PANEL
HDL: 47 mg/dL (ref >39)
LDL Cholesterol: 66 mg/dL (ref 0–99)

## 2011-12-05 NOTE — Assessment & Plan Note (Signed)
Followed by psych, currently stable 

## 2011-12-05 NOTE — Patient Instructions (Addendum)
I recommend you quit smoking, try the nicotine patches  Get the labs done -we will call with results  Restart your fish oil 1 tablet twice a day  Read the handout on colonoscopy  F/U 6 months

## 2011-12-05 NOTE — Assessment & Plan Note (Signed)
Pt counseled on cessation, he is not fully committed to quitting tobacco

## 2011-12-05 NOTE — Assessment & Plan Note (Signed)
Very sedentary s/p surgery, 6lb weight gain

## 2011-12-05 NOTE — Assessment & Plan Note (Signed)
Recheck lipid profile, advised to use Fish oil as directed,may need statin therapy

## 2011-12-05 NOTE — Progress Notes (Signed)
  Subjective:    Patient ID: Ronald Cross, male    DOB: Nov 02, 1960, 52 y.o.   MRN: [id number]  HPI  Pt here to f/u chronic medical problems. No specific concerns. Due for labs, has not been taking fish oil on regular basis for elevated TG. S/P knee surgery now with neuropathic pain in right foot, on gabapentin by ortho which has helped, is in special walking cast for past 2 weeks  Follows at [facility] doing well with exception of recent eviction from housing, states he has had many altercations with a manager on site and she has made false accusations so he was evicted from [employer],  Review of Systems   GEN- denies fatigue, fever, weight loss,weakness, recent illness HEENT- denies eye drainage, change in vision, nasal discharge, CVS- denies chest pain, palpitations RESP- denies SOB, cough, wheeze ABD- denies N/V, change in stools, abd pain GU- denies dysuria, hematuria, dribbling, incontinence MSK- + joint pain, muscle aches, injury Neuro- denies headache, dizziness, syncope, seizure activity       Objective:   Physical Exam GEN- NAD, alert and oriented x3 HEENT- PERRL, EOMI, non injected sclera, pink conjunctiva, MMM, oropharynx clear Neck- Supple,  CVS- RRR, no murmur RESP-CTAB EXT- No edema - left lower ext, RLE in walking cast Pulses- Radial 2+ Psych-Normal affect and Mood         Assessment & Plan:

## 2011-12-05 NOTE — Assessment & Plan Note (Signed)
He's had a few elevated blood pressures which improved after rechecking in the office. He tells me he will not take blood pressure medication even  If it continues to trend upward

## 2011-12-06 LAB — HEMOGLOBIN A1C: Hgb A1c MFr Bld: 6.1 % — ABNORMAL HIGH (ref <5.7)

## 2012-03-12 ENCOUNTER — Other Ambulatory Visit: Payer: Self-pay | Admitting: Family Medicine

## 2012-03-12 DIAGNOSIS — E785 Hyperlipidemia, unspecified: Secondary | ICD-10-CM

## 2012-03-13 NOTE — Progress Notes (Signed)
Spoke with patient and he is aware that he needs followup labs.  Stated that he could not talk at the moment and that he would call back.

## 2012-04-09 ENCOUNTER — Ambulatory Visit (INDEPENDENT_AMBULATORY_CARE_PROVIDER_SITE_OTHER): Payer: Medicare Other | Admitting: Family Medicine

## 2012-04-09 ENCOUNTER — Encounter: Payer: Self-pay | Admitting: Family Medicine

## 2012-04-09 VITALS — BP 134/72 | HR 88 | Temp 99.2°F | Resp 18 | Ht 67.0 in | Wt 210.1 lb

## 2012-04-09 DIAGNOSIS — J209 Acute bronchitis, unspecified: Secondary | ICD-10-CM

## 2012-04-09 DIAGNOSIS — J208 Acute bronchitis due to other specified organisms: Secondary | ICD-10-CM

## 2012-04-09 DIAGNOSIS — F17200 Nicotine dependence, unspecified, uncomplicated: Secondary | ICD-10-CM

## 2012-04-09 DIAGNOSIS — E785 Hyperlipidemia, unspecified: Secondary | ICD-10-CM

## 2012-04-09 DIAGNOSIS — Z720 Tobacco use: Secondary | ICD-10-CM

## 2012-04-09 MED ORDER — ALBUTEROL SULFATE HFA 108 (90 BASE) MCG/ACT IN AERS: 2.0000 | INHALATION_SPRAY | Freq: Four times a day (QID) | RESPIRATORY_TRACT | Status: AC | PRN

## 2012-04-09 MED ORDER — HYDROCODONE-HOMATROPINE 5-1.5 MG/5ML PO SYRP: 5.0000 mL | ORAL_SOLUTION | Freq: Four times a day (QID) | ORAL | Status: DC | PRN

## 2012-04-09 NOTE — Progress Notes (Signed)
**Note Ronald-Identified via Obfuscation**   Subjective:    Patient ID: Ronald Cross, male    DOB: [birth year], 52 y.o.   MRN: [id number]  HPI  Patient presents with cough with minimal production for the past 3 days he feels sore all over his back and chest from coughing he admits to chills and subjective fever nasal congestion he denies any sick contacts denies nausea vomiting diarrhea he does have decreased appetite Due for repeat lipid panel He is still concerned about getting colonoscopy  Review of Systems - per above    GEN- + fatigue, fever, weight loss,weakness, recent illness HEENT- denies eye drainage, change in vision, nasal discharge, CVS- denies chest pain, palpitations RESP- denies SOB,+ cough, +wheeze Neuro- denies headache, dizziness, syncope, seizure activity      Objective:   Physical Exam  GEN- NAD, alert and oriented x3 HEENT- PERRL, EOMI, non injected sclera, pink conjunctiva, MMM, oropharynx mild injection- hyperpigmentation on tongue and posterior oropharynx, TM clear bilat no effusion, no  maxillary sinus tenderness, nares clear  Neck- Supple, no LAD CVS- RRR, no murmur RESP-few scattered wheeze, no rhonchi, normal WOB EXT- No edema Pulses- Radial 2+        Assessment & Plan:  Given a handout on colonoscopy we spent a good amount of time discussing colon cancer screening he still cannot commit to having this done at this time

## 2012-04-09 NOTE — Patient Instructions (Signed)
Viral illness, with some bronchitis Use the cough medicine Use inhaler as needed Plenty of fluids and rest  Get the labs done for your cholesterol Read about cholesterol F/U 3 months

## 2012-04-09 NOTE — Assessment & Plan Note (Signed)
Cough medicine fluids rest of the road he will be given as needed for the wheezing and cough syrup. I'll hold on antibiotics at this time she worsens will start

## 2012-04-09 NOTE — Assessment & Plan Note (Signed)
Recheck fasting lipid panel 

## 2012-04-09 NOTE — Assessment & Plan Note (Signed)
He continues to smoke.

## 2012-07-09 ENCOUNTER — Ambulatory Visit: Payer: Medicare Other | Admitting: Family Medicine

## 2012-07-14 ENCOUNTER — Ambulatory Visit (INDEPENDENT_AMBULATORY_CARE_PROVIDER_SITE_OTHER): Payer: Medicare Other | Admitting: Family Medicine

## 2012-07-14 ENCOUNTER — Encounter: Payer: Self-pay | Admitting: Family Medicine

## 2012-07-14 VITALS — BP 148/84 | HR 86 | Resp 18 | Ht 67.0 in | Wt 217.1 lb

## 2012-07-14 DIAGNOSIS — E785 Hyperlipidemia, unspecified: Secondary | ICD-10-CM

## 2012-07-14 DIAGNOSIS — F17200 Nicotine dependence, unspecified, uncomplicated: Secondary | ICD-10-CM

## 2012-07-14 DIAGNOSIS — Z720 Tobacco use: Secondary | ICD-10-CM

## 2012-07-14 DIAGNOSIS — G8929 Other chronic pain: Secondary | ICD-10-CM

## 2012-07-14 DIAGNOSIS — E669 Obesity, unspecified: Secondary | ICD-10-CM

## 2012-07-14 DIAGNOSIS — I10 Essential (primary) hypertension: Secondary | ICD-10-CM

## 2012-07-14 DIAGNOSIS — M549 Dorsalgia, unspecified: Secondary | ICD-10-CM

## 2012-07-14 NOTE — Assessment & Plan Note (Signed)
Discussed proper diet, his activity is limited Gaining weight

## 2012-07-14 NOTE — Assessment & Plan Note (Signed)
Sent for labs again, he is not watching diet

## 2012-07-14 NOTE — Assessment & Plan Note (Signed)
Declines blood pressure medications, discussed dietary changes handouts provided

## 2012-07-14 NOTE — Assessment & Plan Note (Signed)
Unchanged, on pain contract with Dr. Channing Mutters, Neurosurgery

## 2012-07-14 NOTE — Progress Notes (Signed)
  Subjective:    Patient ID: Ronald Cross, male    DOB: 1960/08/29, 52 y.o.   MRN: [id number]  HPI  Patient here to followup hyperlipidemia. He did not have his fasting labs done. He declines taking any blood pressure medications but states he will take cholesterol medicine if he needs to. He's not been very active and has gained weight. He is under care by his neurosurgeon who is providing his pain medications there've been no recent changes. He states that he gets stiff all over especially when he is sitting for long periods of time denies any fever shortness of breath chest pain recent illness  Review of Systems  GEN- denies fatigue, fever, weight loss,weakness, recent illness HEENT- denies eye drainage, change in vision, nasal discharge, CVS- denies chest pain, palpitations RESP- denies SOB, cough, wheeze ABD- denies N/V, change in stools, abd pain GU- denies dysuria, hematuria, dribbling, incontinence MSK- +joint pain, muscle aches, injury Neuro- denies headache, dizziness, syncope, seizure activity      Objective:   Physical Exam  GEN- NAD, alert and oriented x3, repeat BP 150/84, walks with cane, antalgic gait HEENT- PERRL, EOMI, non injected sclera, pink conjunctiva, MMM, oropharynx clear CVS- RRR, no murmur RESP-CTAB EXT- No edema Pulses- Radial 2+       Assessment & Plan:

## 2012-07-14 NOTE — Patient Instructions (Addendum)
I recommend a blood pressure pill, check your blood pressure  Get come the labs done Saturday Avoid salt, add more potassium F/U 3 months  Hypertension As your heart beats, it forces blood through your arteries. This force is your blood pressure. If the pressure is too high, it is called hypertension (HTN) or high blood pressure. HTN is dangerous because you may have it and not know it. High blood pressure may mean that your heart has to work harder to pump blood. Your arteries may be narrow or stiff. The extra work puts you at risk for heart disease, stroke, and other problems.  Blood pressure consists of two numbers, a higher number over a lower, 110/72, for example. It is stated as "110 over 72." The ideal is below 120 for the top number (systolic) and under 80 for the bottom (diastolic). Write down your blood pressure today. You should pay close attention to your blood pressure if you have certain conditions such as:  Heart failure.  Prior heart attack.  Diabetes  Chronic kidney disease.  Prior stroke.  Multiple risk factors for heart disease. To see if you have HTN, your blood pressure should be measured while you are seated with your arm held at the level of the heart. It should be measured at least twice. A one-time elevated blood pressure reading (especially in the Emergency Department) does not mean that you need treatment. There may be conditions in which the blood pressure is different between your right and left arms. It is important to see your caregiver soon for a recheck. Most people have essential hypertension which means that there is not a specific cause. This type of high blood pressure may be lowered by changing lifestyle factors such as:  Stress.  Smoking.  Lack of exercise.  Excessive weight.  Drug/tobacco/alcohol use.  Eating less salt. Most people do not have symptoms from high blood pressure until it has caused damage to the body. Effective treatment can  often prevent, delay or reduce that damage. TREATMENT  When a cause has been identified, treatment for high blood pressure is directed at the cause. There are a large number of medications to treat HTN. These fall into several categories, and your caregiver will help you select the medicines that are best for you. Medications may have side effects. You should review side effects with your caregiver. If your blood pressure stays high after you have made lifestyle changes or started on medicines,   Your medication(s) may need to be changed.  Other problems may need to be addressed.  Be certain you understand your prescriptions, and know how and when to take your medicine.  Be sure to follow up with your caregiver within the time frame advised (usually within two weeks) to have your blood pressure rechecked and to review your medications.  If you are taking more than one medicine to lower your blood pressure, make sure you know how and at what times they should be taken. Taking two medicines at the same time can result in blood pressure that is too low. SEEK IMMEDIATE MEDICAL CARE IF:  You develop a severe headache, blurred or changing vision, or confusion.  You have unusual weakness or numbness, or a faint feeling.  You have severe chest or abdominal pain, vomiting, or breathing problems. MAKE SURE YOU:   Understand these instructions.  Will watch your condition.  Will get help right away if you are not doing well or get worse. Document Released: 02/25/2005 Document Revised:  05/20/2011 Document Reviewed: 10/16/2007 [facility] Patient Information 2013 [place], [place].

## 2012-07-14 NOTE — Assessment & Plan Note (Signed)
Counseled on cessation., pt not ready to quit

## 2012-12-22 ENCOUNTER — Emergency Department (HOSPITAL_COMMUNITY): Payer: Medicare Other

## 2012-12-22 ENCOUNTER — Encounter (HOSPITAL_COMMUNITY): Payer: Self-pay | Admitting: Emergency Medicine

## 2012-12-22 ENCOUNTER — Emergency Department (HOSPITAL_COMMUNITY)
Admission: EM | Admit: 2012-12-22 | Discharge: 2012-12-22 | Disposition: A | Discharge status: Home / Self Care | Payer: Medicare Other | Attending: Emergency Medicine | Admitting: Emergency Medicine

## 2012-12-22 DIAGNOSIS — F17200 Nicotine dependence, unspecified, uncomplicated: Secondary | ICD-10-CM | POA: Insufficient documentation

## 2012-12-22 DIAGNOSIS — S139XXA Sprain of joints and ligaments of unspecified parts of neck, initial encounter: Secondary | ICD-10-CM | POA: Insufficient documentation

## 2012-12-22 DIAGNOSIS — Y929 Unspecified place or not applicable: Secondary | ICD-10-CM | POA: Insufficient documentation

## 2012-12-22 DIAGNOSIS — F319 Bipolar disorder, unspecified: Secondary | ICD-10-CM | POA: Insufficient documentation

## 2012-12-22 DIAGNOSIS — Z79899 Other long term (current) drug therapy: Secondary | ICD-10-CM | POA: Insufficient documentation

## 2012-12-22 DIAGNOSIS — X58XXXA Exposure to other specified factors, initial encounter: Secondary | ICD-10-CM | POA: Insufficient documentation

## 2012-12-22 DIAGNOSIS — Z8739 Personal history of other diseases of the musculoskeletal system and connective tissue: Secondary | ICD-10-CM | POA: Insufficient documentation

## 2012-12-22 DIAGNOSIS — R51 Headache: Secondary | ICD-10-CM | POA: Insufficient documentation

## 2012-12-22 DIAGNOSIS — S161XXA Strain of muscle, fascia and tendon at neck level, initial encounter: Secondary | ICD-10-CM

## 2012-12-22 DIAGNOSIS — Y939 Activity, unspecified: Secondary | ICD-10-CM | POA: Insufficient documentation

## 2012-12-22 MED ORDER — LORAZEPAM 2 MG/ML IJ SOLN
1.0000 mg | Freq: Once | INTRAMUSCULAR | Status: AC
  Administered 2012-12-22: 1 mg via INTRAVENOUS
  Filled 2012-12-22: qty 1

## 2012-12-22 MED ORDER — CYCLOBENZAPRINE HCL 10 MG PO TABS: 10.0000 mg | ORAL_TABLET | Freq: Three times a day (TID) | ORAL | Status: AC | PRN

## 2012-12-22 MED ORDER — HYDROMORPHONE HCL PF 1 MG/ML IJ SOLN
1.0000 mg | Freq: Once | INTRAMUSCULAR | Status: AC
  Administered 2012-12-22: 1 mg via INTRAVENOUS
  Filled 2012-12-22: qty 1

## 2012-12-22 MED ORDER — OXYCODONE-ACETAMINOPHEN 5-325 MG PO TABS: 1.0000 | ORAL_TABLET | Freq: Four times a day (QID) | ORAL | Status: AC | PRN

## 2012-12-22 NOTE — ED Notes (Signed)
Pt reports headache for the past 3 days.   Pt reports headache that radiates into his neck.  Pt denies any n/v.

## 2012-12-22 NOTE — ED Provider Notes (Signed)
CSN: [id number]     Arrival date & time 12/22/12  4098 History  This chart was scribed for Benny Lennert, MD by Quintella Reichert, ED scribe.  This patient was seen in room APA03/APA03 and the patient's care was started at 11:22 AM.   Chief Complaint  Patient presents with  . Headache    Patient is a 52 y.o. male presenting with headaches. The history is provided by the patient. No language interpreter was used.  Headache Radiates to: neck. Pain severity now: Moderate-to-severe. Onset quality:  Gradual Duration:  3 days Progression:  Worsening Chronicity:  Recurrent Similar to prior headaches: yes (but more severe and persistent)   Worsened by:  Neck movement Associated symptoms: no abdominal pain, no back pain, no congestion, no cough, no diarrhea, no fatigue, no seizures and no sinus pressure     HPI Comments: Ronald Cross is a 52 y.o. male who presents to the Emergency Department complaining of a progressively-worsening headache that began 3 days ago.  Pt states his pain began in his neck and has since spread to his head.  Pain is greatly exacerbated by any head movements.  Pt states that he has had similar symptoms one time before but on that occasion it was less persistent and severe and resolved on its own.     Past Medical History  Diagnosis Date  . Back pain   . Heel spur Since Age 66    Pt walks with cane or crutches since age 89  . Torn ACL 2012  . Bipolar disorder   . Depression     Past Surgical History  Procedure Laterality Date  . Back surgery  2006    Fusion- Dr. Channing Mutters  . Right knee      ACL    Family History  Problem Relation Age of Onset  . COPD Mother   . Heart disease Mother   . Diabetes Father     History  Substance Use Topics  . Smoking status: Current Every Day Smoker -- 0.50 packs/day    Types: Cigarettes  . Smokeless tobacco: Not on file  . Alcohol Use: Yes     Comment: once a week     Review of Systems  Constitutional: Negative  for appetite change and fatigue.  HENT: Negative for congestion, ear discharge and sinus pressure.   Eyes: Negative for discharge.  Respiratory: Negative for cough.   Cardiovascular: Negative for chest pain.  Gastrointestinal: Negative for abdominal pain and diarrhea.  Genitourinary: Negative for frequency and hematuria.  Musculoskeletal: Negative for back pain.  Skin: Negative for rash.  Neurological: Negative for seizures and headaches.  Psychiatric/Behavioral: Negative for hallucinations.     Allergies  Review of patient's allergies indicates no known allergies.  Home Medications   Current Outpatient Rx  Name  Route  Sig  Dispense  Refill  . albuterol (PROVENTIL HFA;VENTOLIN HFA) 108 (90 BASE) MCG/ACT inhaler   Inhalation   Inhale 2 puffs into the lungs every 6 (six) hours as needed for wheezing.   1 Inhaler   2   . ALPRAZolam (XANAX) 1 MG tablet   Oral   Take 1 mg by mouth 3 (three) times daily.           . fish oil-omega-3 fatty acids 1000 MG capsule   Oral   Take 1 g by mouth daily.         Marland Kitchen gabapentin (NEURONTIN) 300 MG capsule   Oral   Take 300 mg by  mouth 3 (three) times daily.          Marland Kitchen oxyCODONE-acetaminophen (PERCOCET) 10-325 MG per tablet   Oral   Take 1 tablet by mouth 3 (three) times daily.          . traZODone (DESYREL) 50 MG tablet   Oral   Take 50 mg by mouth at bedtime.            BP 143/98  Pulse 98  Temp(Src) 98 F (36.7 C) (Oral)  Resp 16  SpO2 99%  Physical Exam  Nursing note and vitals reviewed. Constitutional: He is oriented to person, place, and time. He appears well-developed.  HENT:  Head: Normocephalic.  Eyes: Conjunctivae and EOM are normal. No scleral icterus.  Neck: Normal range of motion. Neck supple. No thyromegaly present.  Cardiovascular: Normal rate and regular rhythm.  Exam reveals no gallop and no friction rub.   No murmur heard. Pulmonary/Chest: No stridor. He has no wheezes. He has no rales. He  exhibits no tenderness.  Abdominal: He exhibits no distension. There is no tenderness. There is no rebound.  Musculoskeletal: Normal range of motion. He exhibits no edema.  Left lateral neck muscles very tender  Lymphadenopathy:    He has no cervical adenopathy.  Neurological: He is oriented to person, place, and time. He exhibits normal muscle tone. Coordination normal.  Skin: No rash noted. No erythema.    ED Course  Procedures (including critical care time)  DIAGNOSTIC STUDIES: Oxygen Saturation is 99% on room air, normal by my interpretation.    COORDINATION OF CARE: 11:26 AM-Discussed treatment plan which includes pain medication and imaging with pt at bedside and pt agreed to plan.    Labs Review Labs Reviewed - No data to display  Imaging Review No results found.  EKG Interpretation   None       MDM  No diagnosis found.     The chart was scribed for me under my direct supervision.  I personally performed the history, physical, and medical decision making and all procedures in the evaluation of this patient.Benny Lennert, MD 12/22/12 [phone]

## 2014-01-27 ENCOUNTER — Encounter (HOSPITAL_COMMUNITY): Payer: Self-pay

## 2014-01-27 ENCOUNTER — Emergency Department (HOSPITAL_COMMUNITY)
Admission: EM | Admit: 2014-01-27 | Discharge: 2014-01-27 | Disposition: A | Discharge status: Home / Self Care | Payer: Medicare Other | Attending: Emergency Medicine | Admitting: Emergency Medicine

## 2014-01-27 DIAGNOSIS — M25511 Pain in right shoulder: Secondary | ICD-10-CM | POA: Insufficient documentation

## 2014-01-27 DIAGNOSIS — Z79899 Other long term (current) drug therapy: Secondary | ICD-10-CM | POA: Diagnosis not present

## 2014-01-27 DIAGNOSIS — F319 Bipolar disorder, unspecified: Secondary | ICD-10-CM | POA: Diagnosis not present

## 2014-01-27 DIAGNOSIS — Z720 Tobacco use: Secondary | ICD-10-CM | POA: Diagnosis not present

## 2014-01-27 DIAGNOSIS — H9201 Otalgia, right ear: Secondary | ICD-10-CM | POA: Diagnosis present

## 2014-01-27 DIAGNOSIS — M549 Dorsalgia, unspecified: Secondary | ICD-10-CM | POA: Insufficient documentation

## 2014-01-27 MED ORDER — LORATADINE-PSEUDOEPHEDRINE ER 5-120 MG PO TB12: 1.0000 | ORAL_TABLET | Freq: Two times a day (BID) | ORAL | Status: AC

## 2014-01-27 MED ORDER — CEPHALEXIN 500 MG PO CAPS: 500.0000 mg | ORAL_CAPSULE | Freq: Four times a day (QID) | ORAL | Status: AC

## 2014-01-27 MED ORDER — PSEUDOEPHEDRINE HCL 60 MG PO TABS
60.0000 mg | ORAL_TABLET | Freq: Once | ORAL | Status: AC
  Administered 2014-01-27: 60 mg via ORAL
  Filled 2014-01-27: qty 1

## 2014-01-27 MED ORDER — OXYCODONE-ACETAMINOPHEN 5-325 MG PO TABS
1.0000 | ORAL_TABLET | Freq: Once | ORAL | Status: AC
  Administered 2014-01-27: 1 via ORAL
  Filled 2014-01-27: qty 1

## 2014-01-27 MED ORDER — CEPHALEXIN 500 MG PO CAPS
500.0000 mg | ORAL_CAPSULE | Freq: Once | ORAL | Status: AC
  Administered 2014-01-27: 500 mg via ORAL
  Filled 2014-01-27: qty 1

## 2014-01-27 NOTE — ED Provider Notes (Signed)
CSN: [id number]     Arrival date & time 01/27/14  1026 History   First MD Initiated Contact with Patient 01/27/14 1123     Chief Complaint  Patient presents with  . Otalgia     (Consider location/radiation/quality/duration/timing/severity/associated sxs/prior Treatment) Patient is a 53 y.o. male presenting with ear pain. The history is provided by the patient.  Otalgia Location:  Right Behind ear:  No abnormality Quality:  Aching and throbbing Severity:  Moderate Onset quality:  Gradual Duration:  3 days Timing:  Intermittent Progression:  Worsening Chronicity:  New Context: not direct blow and not foreign body in ear   Relieved by:  Nothing Associated symptoms: no abdominal pain, no cough and no neck pain   Risk factors: no recent travel     Past Medical History  Diagnosis Date  . Back pain   . Heel spur Since Age 26    Pt walks with cane or crutches since age 53  . Torn ACL 2012  . Bipolar disorder   . Depression    Past Surgical History  Procedure Laterality Date  . Back surgery  2006    Fusion- Dr. Channing Muttersoy  . Right knee      ACL   Family History  Problem Relation Age of Onset  . COPD Mother   . Heart disease Mother   . Diabetes Father    History  Substance Use Topics  . Smoking status: Current Every Day Smoker -- 0.50 packs/day    Types: Cigarettes  . Smokeless tobacco: Not on file  . Alcohol Use: Yes     Comment: once a week    Review of Systems  Constitutional: Negative for activity change.       All ROS Neg except as noted in HPI  HENT: Positive for ear pain. Negative for nosebleeds.   Eyes: Negative for photophobia and discharge.  Respiratory: Negative for cough, shortness of breath and wheezing.   Cardiovascular: Negative for chest pain and palpitations.  Gastrointestinal: Negative for abdominal pain and blood in stool.  Genitourinary: Negative for dysuria, frequency and hematuria.  Musculoskeletal: Positive for back pain and arthralgias.  Negative for neck pain.  Skin: Negative.   Neurological: Negative for dizziness, seizures and speech difficulty.  Psychiatric/Behavioral: Negative for hallucinations and confusion.       Depression      Allergies  Review of patient's allergies indicates no known allergies.  Home Medications   Prior to Admission medications   Medication Sig Start Date End Date Taking? Authorizing Provider  albuterol (PROVENTIL HFA;VENTOLIN HFA) 108 (90 BASE) MCG/ACT inhaler Inhale 2 puffs into the lungs every 6 (six) hours as needed for wheezing. 04/09/12  Yes Salley ScarletKawanta F [place], MD  ALPRAZolam Prudy Feeler(XANAX) 1 MG tablet Take 1 mg by mouth 3 (three) times daily.     Yes Historical Provider, MD  fish oil-omega-3 fatty acids 1000 MG capsule Take 1 g by mouth daily.   Yes Historical Provider, MD  gabapentin (NEURONTIN) 300 MG capsule Take 300 mg by mouth 3 (three) times daily.  11/12/11  Yes Historical Provider, MD  metFORMIN (GLUCOPHAGE) 500 MG tablet Take 1 tablet by mouth 2 (two) times daily. 01/13/14  Yes Historical Provider, MD  naproxen (NAPROSYN) 500 MG tablet Take 1 tablet by mouth 2 (two) times daily as needed for mild pain.  10/21/13  Yes Historical Provider, MD  oxyCODONE-acetaminophen (PERCOCET) 10-325 MG per tablet Take 1 tablet by mouth 3 (three) times daily.  07/02/12  Yes Historical Provider,  MD  traZODone (DESYREL) 50 MG tablet Take 50 mg by mouth at bedtime.     Yes Historical Provider, MD  cyclobenzaprine (FLEXERIL) 10 MG tablet Take 1 tablet (10 mg total) by mouth 3 (three) times daily as needed for muscle spasms. Patient not taking: Reported on 01/27/2014 12/22/12   Benny LennertJoseph L Zammit, MD  oxyCODONE-acetaminophen (PERCOCET) 5-325 MG per tablet Take 1 tablet by mouth every 6 (six) hours as needed for pain. Patient not taking: Reported on 01/27/2014 12/22/12   Benny LennertJoseph L Zammit, MD   BP 124/95 mmHg  Pulse 88  Temp(Src) 98.8 F (37.1 C) (Oral)  Resp 16  SpO2 98% Physical Exam  Constitutional: He is  oriented to person, place, and time. He appears well-developed and well-nourished.  Non-toxic appearance.  HENT:  Head: Normocephalic.  Right Ear: Tympanic membrane and external ear normal.  Left Ear: Tympanic membrane and external ear normal.  Mild swelling at the base of the right TM. No bulging.  Eyes: EOM and lids are normal. Pupils are equal, round, and reactive to light.  Neck: Normal range of motion. Neck supple. Carotid bruit is not present.  Stiffness of the neck, not new.    Cardiovascular: Normal rate, regular rhythm, normal heart sounds, intact distal pulses and normal pulses.   Pulmonary/Chest: Breath sounds normal. No respiratory distress.  Abdominal: Soft. Bowel sounds are normal. There is no tenderness. There is no guarding.  Musculoskeletal: Normal range of motion.  Pain with attempted ROM of the right shoulder, with extention of the pain to the right neck.  Lymphadenopathy:       Head (right side): No submandibular adenopathy present.       Head (left side): No submandibular adenopathy present.    He has no cervical adenopathy.  Neurological: He is alert and oriented to person, place, and time. He has normal strength. No cranial nerve deficit or sensory deficit.  Skin: Skin is warm and dry.  Psychiatric: He has a normal mood and affect. His speech is normal.  Nursing note and vitals reviewed.   ED Course  Procedures (including critical care time) Labs Review Labs Reviewed - No data to display  Imaging Review No results found.   EKG Interpretation None      MDM  Exam support otalgia of the right ear and musculoskeletal right shoulder pain. Plan - Claritin D and keflex given to the patient. Pt to see orthopedics for additional evaluation and management of the right shoulder.   Final diagnoses:  Otalgia of right ear  Shoulder pain, right    *I have reviewed nursing notes, vital signs, and all appropriate lab and imaging results for this  patient.Kathie Dike*    Cylus Douville M Grettel Rames, PA-C 01/29/14 1752  Samuel JesterKathleen McManus, DO 01/31/14 [phone]

## 2014-01-27 NOTE — ED Notes (Signed)
Ear pain x3 days.  With ringing in ear as well.

## 2014-01-27 NOTE — Discharge Instructions (Signed)
Please use the Keflex 4 times daily with food, as well as the Claritin-D for congestion to remove any pressure off of the portions of the inner aspect of the ear. May continue your current medications for your discomfort. Please see Dr. Delbert Harnesson Diego concerning possible workup concerning your neck and shoulder. Arthritis, Nonspecific Arthritis is inflammation of a joint. This usually means pain, redness, warmth or swelling are present. One or more joints may be involved. There are a number of types of arthritis. Your caregiver may not be able to tell what type of arthritis you have right away. CAUSES  The most common cause of arthritis is the wear and tear on the joint (osteoarthritis). This causes damage to the cartilage, which can break down over time. The knees, hips, back and neck are most often affected by this type of arthritis. Other types of arthritis and common causes of joint pain include:  Sprains and other injuries near the joint. Sometimes minor sprains and injuries cause pain and swelling that develop hours later.  Rheumatoid arthritis. This affects hands, feet and knees. It usually affects both sides of your body at the same time. It is often associated with chronic ailments, fever, weight loss and general weakness.  Crystal arthritis. Gout and pseudo gout can cause occasional acute severe pain, redness and swelling in the foot, ankle, or knee.  Infectious arthritis. Bacteria can get into a joint through a break in overlying skin. This can cause infection of the joint. Bacteria and viruses can also spread through the blood and affect your joints.  Drug, infectious and allergy reactions. Sometimes joints can become mildly painful and slightly swollen with these types of illnesses. SYMPTOMS   Pain is the main symptom.  Your joint or joints can also be red, swollen and warm or hot to the touch.  You may have a fever with certain types of arthritis, or even feel overall ill.  The joint  with arthritis will hurt with movement. Stiffness is present with some types of arthritis. DIAGNOSIS  Your caregiver will suspect arthritis based on your description of your symptoms and on your exam. Testing may be needed to find the type of arthritis:  Blood and sometimes urine tests.  X-ray tests and sometimes CT or MRI scans.  Removal of fluid from the joint (arthrocentesis) is done to check for bacteria, crystals or other causes. Your caregiver (or a specialist) will numb the area over the joint with a local anesthetic, and use a needle to remove joint fluid for examination. This procedure is only minimally uncomfortable.  Even with these tests, your caregiver may not be able to tell what kind of arthritis you have. Consultation with a specialist (rheumatologist) may be helpful. TREATMENT  Your caregiver will discuss with you treatment specific to your type of arthritis. If the specific type cannot be determined, then the following general recommendations may apply. Treatment of severe joint pain includes:  Rest.  Elevation.  Anti-inflammatory medication (for example, ibuprofen) may be prescribed. Avoiding activities that cause increased pain.  Only take over-the-counter or prescription medicines for pain and discomfort as recommended by your caregiver.  Cold packs over an inflamed joint may be used for 10 to 15 minutes every hour. Hot packs sometimes feel better, but do not use overnight. Do not use hot packs if you are diabetic without your caregiver's permission.  A cortisone shot into arthritic joints may help reduce pain and swelling.  Any acute arthritis that gets worse over the next  1 to 2 days needs to be looked at to be sure there is no joint infection. Long-term arthritis treatment involves modifying activities and lifestyle to reduce joint stress jarring. This can include weight loss. Also, exercise is needed to nourish the joint cartilage and remove waste. This helps keep  the muscles around the joint strong. HOME CARE INSTRUCTIONS   Do not take aspirin to relieve pain if gout is suspected. This elevates uric acid levels.  Only take over-the-counter or prescription medicines for pain, discomfort or fever as directed by your caregiver.  Rest the joint as much as possible.  If your joint is swollen, keep it elevated.  Use crutches if the painful joint is in your leg.  Drinking plenty of fluids may help for certain types of arthritis.  Follow your caregiver's dietary instructions.  Try low-impact exercise such as:  Swimming.  Water aerobics.  Biking.  Walking.  Morning stiffness is often relieved by a warm shower.  Put your joints through regular range-of-motion. SEEK MEDICAL CARE IF:   You do not feel better in 24 hours or are getting worse.  You have side effects to medications, or are not getting better with treatment. SEEK IMMEDIATE MEDICAL CARE IF:   You have a fever.  You develop severe joint pain, swelling or redness.  Many joints are involved and become painful and swollen.  There is severe back pain and/or leg weakness.  You have loss of bowel or bladder control. Document Released: 04/04/2004 Document Revised: 05/20/2011 Document Reviewed: 04/20/2008 [facility] Patient Information 2015 [employer] [place], [place]. This information is not intended to replace advice given to you by your health care provider. Make sure you discuss any questions you have with your health care provider.

## 2014-05-05 ENCOUNTER — Emergency Department (HOSPITAL_COMMUNITY)
Admission: EM | Admit: 2014-05-05 | Discharge: 2014-05-06 | Disposition: A | Discharge status: Home / Self Care | Payer: Medicare Other | Attending: Emergency Medicine | Admitting: Emergency Medicine

## 2014-05-05 ENCOUNTER — Emergency Department (HOSPITAL_COMMUNITY): Payer: Medicare Other

## 2014-05-05 ENCOUNTER — Encounter (HOSPITAL_COMMUNITY): Payer: Self-pay

## 2014-05-05 DIAGNOSIS — H1132 Conjunctival hemorrhage, left eye: Secondary | ICD-10-CM

## 2014-05-05 DIAGNOSIS — Y9289 Other specified places as the place of occurrence of the external cause: Secondary | ICD-10-CM | POA: Diagnosis not present

## 2014-05-05 DIAGNOSIS — Z8739 Personal history of other diseases of the musculoskeletal system and connective tissue: Secondary | ICD-10-CM | POA: Diagnosis not present

## 2014-05-05 DIAGNOSIS — S0990XA Unspecified injury of head, initial encounter: Secondary | ICD-10-CM | POA: Diagnosis not present

## 2014-05-05 DIAGNOSIS — Z791 Long term (current) use of non-steroidal anti-inflammatories (NSAID): Secondary | ICD-10-CM | POA: Insufficient documentation

## 2014-05-05 DIAGNOSIS — Z79899 Other long term (current) drug therapy: Secondary | ICD-10-CM | POA: Insufficient documentation

## 2014-05-05 DIAGNOSIS — Z792 Long term (current) use of antibiotics: Secondary | ICD-10-CM | POA: Diagnosis not present

## 2014-05-05 DIAGNOSIS — Z720 Tobacco use: Secondary | ICD-10-CM | POA: Insufficient documentation

## 2014-05-05 DIAGNOSIS — Y9389 Activity, other specified: Secondary | ICD-10-CM | POA: Insufficient documentation

## 2014-05-05 DIAGNOSIS — Y998 Other external cause status: Secondary | ICD-10-CM | POA: Diagnosis not present

## 2014-05-05 MED ORDER — OXYCODONE-ACETAMINOPHEN 5-325 MG PO TABS
2.0000 | ORAL_TABLET | Freq: Once | ORAL | Status: AC
  Administered 2014-05-05: 2 via ORAL
  Filled 2014-05-05: qty 2

## 2014-05-05 MED ORDER — DIAZEPAM 5 MG PO TABS: 5.0000 mg | ORAL_TABLET | Freq: Once | ORAL | Status: AC

## 2014-05-05 MED ORDER — OXYCODONE-ACETAMINOPHEN 5-325 MG PO TABS: 2.0000 | ORAL_TABLET | Freq: Once | ORAL | Status: AC

## 2014-05-05 MED ORDER — DIAZEPAM 5 MG PO TABS
5.0000 mg | ORAL_TABLET | Freq: Once | ORAL | Status: AC
  Administered 2014-05-05: 5 mg via ORAL
  Filled 2014-05-05: qty 1

## 2014-05-05 NOTE — ED Notes (Signed)
Patient continues to curse and slam personal items around patient room. Patient does have blood noted to left inner eye

## 2014-05-05 NOTE — ED Notes (Signed)
Patient has dried blood noted to left inner nostril
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
## 2014-05-05 NOTE — ED Notes (Signed)
Patient ambulatory out of room up and down hallway. Patient states if he "sits too long he will get stiff"
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
## 2014-05-06 NOTE — ED Notes (Signed)
Patient ambulatory in hallway, no deficit noted

## 2014-05-06 NOTE — ED Notes (Signed)
Patient verbalizes understanding of discharge instructions, home care, and follow up care if needed. Patient ambulatory out of department at this time. 

## 2017-08-25 ENCOUNTER — Other Ambulatory Visit (HOSPITAL_COMMUNITY): Payer: Self-pay | Admitting: Family Medicine

## 2017-08-25 DIAGNOSIS — M545 Low back pain: Secondary | ICD-10-CM

## 2017-09-10 ENCOUNTER — Ambulatory Visit (HOSPITAL_COMMUNITY)
Admission: RE | Admit: 2017-09-10 | Discharge: 2017-09-10 | Disposition: A | Discharge status: Home / Self Care | Payer: Medicare Other | Source: Ambulatory Visit | Attending: Family Medicine | Admitting: Family Medicine

## 2017-09-10 DIAGNOSIS — M545 Low back pain: Secondary | ICD-10-CM | POA: Diagnosis present

## 2017-09-10 MED ORDER — IOPAMIDOL (ISOVUE-300) INJECTION 61%
100.0000 mL | Freq: Once | INTRAVENOUS | Status: AC | PRN
  Administered 2017-09-10: 100 mL via INTRAVENOUS

## 2018-07-28 ENCOUNTER — Emergency Department (HOSPITAL_COMMUNITY)
Admission: EM | Admit: 2018-07-28 | Discharge: 2018-07-28 | Disposition: A | Discharge status: Home / Self Care | Payer: Medicare Other | Attending: Emergency Medicine | Admitting: Emergency Medicine

## 2018-07-28 ENCOUNTER — Emergency Department (HOSPITAL_COMMUNITY): Payer: Medicare Other

## 2018-07-28 ENCOUNTER — Encounter (HOSPITAL_COMMUNITY): Payer: Self-pay

## 2018-07-28 ENCOUNTER — Other Ambulatory Visit: Payer: Self-pay

## 2018-07-28 DIAGNOSIS — R079 Chest pain, unspecified: Secondary | ICD-10-CM

## 2018-07-28 DIAGNOSIS — Z79899 Other long term (current) drug therapy: Secondary | ICD-10-CM | POA: Insufficient documentation

## 2018-07-28 DIAGNOSIS — I10 Essential (primary) hypertension: Secondary | ICD-10-CM | POA: Diagnosis not present

## 2018-07-28 DIAGNOSIS — R0789 Other chest pain: Secondary | ICD-10-CM | POA: Diagnosis present

## 2018-07-28 DIAGNOSIS — R911 Solitary pulmonary nodule: Secondary | ICD-10-CM | POA: Diagnosis not present

## 2018-07-28 HISTORY — DX: Unspecified osteoarthritis, unspecified site: M19.90

## 2018-07-28 LAB — BASIC METABOLIC PANEL
Anion gap: 11 (ref 5–15)
BUN: 5 mg/dL — ABNORMAL LOW (ref 6–20)
CO2: 25 mmol/L (ref 22–32)
Calcium: 8.4 mg/dL — ABNORMAL LOW (ref 8.9–10.3)
Chloride: 102 mmol/L (ref 98–111)
Creatinine, Ser: 0.69 mg/dL (ref 0.61–1.24)
GFR calc Af Amer: >60 mL/min (ref >60)
GFR calc non Af Amer: >60 mL/min (ref >60)
Glucose, Bld: 165 mg/dL — ABNORMAL HIGH (ref 70–99)
Potassium: 3.6 mmol/L (ref 3.5–5.1)
Sodium: 138 mmol/L (ref 135–145)

## 2018-07-28 LAB — CBC
HCT: 42.2 % (ref 39.0–52.0)
Hemoglobin: 13.8 g/dL (ref 13.0–17.0)
MCH: 29.4 pg (ref 26.0–34.0)
MCHC: 32.7 g/dL (ref 30.0–36.0)
MCV: 89.8 fL (ref 80.0–100.0)
Platelets: 209 10*3/uL (ref 150–400)
RBC: 4.7 MIL/uL (ref 4.22–5.81)
RDW: 12.5 % (ref 11.5–15.5)
WBC: 7.5 10*3/uL (ref 4.0–10.5)
nRBC: 0 % (ref 0.0–0.2)

## 2018-07-28 LAB — TROPONIN I
Troponin I: 0.04 ng/mL (ref <0.03)
Troponin I: 0.04 ng/mL (ref <0.03)

## 2018-07-28 MED ORDER — DOXYCYCLINE HYCLATE 100 MG PO TABS: 100.0000 mg | ORAL_TABLET | Freq: Two times a day (BID) | ORAL | 0 refills | Status: AC

## 2018-07-28 MED ORDER — IOHEXOL 350 MG/ML SOLN
100.0000 mL | Freq: Once | INTRAVENOUS | Status: AC | PRN
  Administered 2018-07-28: 100 mL via INTRAVENOUS
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
## 2018-07-28 NOTE — ED Notes (Signed)
CRITICAL VALUE ALERT  Critical Value:  Trop 0.04  Date & Time Notied:  07/28/2018, 1506  Provider Notified: Dr. Rehman/Dr. Jodi Mourning  Orders Received/Actions taken: no new orders

## 2018-07-28 NOTE — ED Notes (Signed)
Pt in xray

## 2018-07-28 NOTE — Discharge Instructions (Addendum)
You presented to the ED with right sided chest pain. Your labs and imaging were normal. I suspect this pain is due to a strained muscle from your cough. Due to some changes on your chest xray and cough I have sent in a prescription for a 5-day course of antibiotics.  I want you to take 100 mg of doxycycline twice a day for 5 days.  For your right-sided chest pain take over-the-counter pain medication such as Tylenol and ibuprofen.  You can also try icy hot to see if that alleviates your pain.  Please follow-up with your PCP in 1 week.  Your PCP will also have to help you arrange for a CT scan of the chest in about 6 months to follow-up on the pulmonary nodule.

## 2018-07-28 NOTE — ED Notes (Signed)
Attempted to obtain coronoavirus specimen but pt could not tolerate it.

## 2018-07-28 NOTE — ED Provider Notes (Addendum)
[facility] EMERGENCY DEPARTMENT Provider Note   CSN: [id number] Arrival date & time: 07/28/18  1101    History   Chief Complaint Chief Complaint  Patient presents with  . Chest Pain    HPI Ronald Cross is a 58 y.o. male with HTN and arthritis presenting to the ED with chest pain. He reports two days ago he started having significant weakness. Then last night he started having chest pain that radiated to his right side and back. He describes the pain as a tightness and a dull ache. He denies any cardiac history, SOB, palpitations, sweating, n/v, abdominal pain, diarrhea, constipation, difficulty urinating, headaches and rashes.  States the pain was so severe last night that he was unable to lay on his side.  Movements tend to make his pain worse.  Denies any recent sick contacts or illnesses. He smokes half a pack of cigarettes a day.     HPI  Past Medical History:  Diagnosis Date  . Arthritis   . Back pain   . Bipolar disorder (HCC)   . Depression   . Heel spur Since Age 48   Pt walks with cane or crutches since age 59  . Torn ACL 2012    Patient Active Problem List   Diagnosis Date Noted  . Essential hypertension, benign 07/14/2012  . Hyperlipidemia 12/05/2011  . Difficulty in walking(719.7) 05/06/2011  . Obesity 03/26/2011  . Knee pain 03/26/2011  . Chronic back pain 03/26/2011  . Tobacco user 03/26/2011  . Bipolar disorder (HCC) 03/26/2011    Past Surgical History:  Procedure Laterality Date  . BACK SURGERY  2006   Fusion- Dr. Channing Mutters  . right knee     ACL        Home Medications    Prior to Admission medications   Medication Sig Start Date End Date Taking? Authorizing Provider  albuterol (PROVENTIL HFA;VENTOLIN HFA) 108 (90 BASE) MCG/ACT inhaler Inhale 2 puffs into the lungs every 6 (six) hours as needed for wheezing. 04/09/12  Yes [place], Velna Hatchet, MD  ALPRAZolam Prudy Feeler) 1 MG tablet Take 1 mg by mouth 3 (three) times daily.     Yes [provider]  amLODipine (NORVASC) 10 MG tablet Take 1 tablet by mouth daily. 06/23/18  Yes [provider]  fish oil-omega-3 fatty acids 1000 MG capsule Take 1 g by mouth daily.   Yes [provider]  lisinopril (ZESTRIL) 20 MG tablet Take 1 tablet by mouth daily. 06/23/18  Yes [provider]  oxyCODONE-acetaminophen (PERCOCET) 10-325 MG per tablet Take 1 tablet by mouth 3 (three) times daily.  07/02/12  Yes [provider]  tadalafil (CIALIS) 5 MG tablet Take 1 tablet by mouth daily. 07/17/18  Yes [provider]  traZODone (DESYREL) 50 MG tablet Take 50 mg by mouth at bedtime.     Yes [provider]  cephALEXin (KEFLEX) 500 MG capsule Take 1 capsule (500 mg total) by mouth 4 (four) times daily. Patient not taking: Reported on 05/05/2014 01/27/14   Ivery Quale, PA-C  cyclobenzaprine (FLEXERIL) 10 MG tablet Take 1 tablet (10 mg total) by mouth 3 (three) times daily as needed for muscle spasms. Patient not taking: Reported on 01/27/2014 12/22/12   Bethann Berkshire, MD  doxycycline (VIBRA-TABS) 100 MG tablet Take 1 tablet (100 mg total) by mouth 2 (two) times daily for 5 days. 07/28/18 08/02/18  ,  N, DO  loratadine-pseudoephedrine (CLARITIN-D 12 HOUR) 5-120 MG per tablet Take 1 tablet by mouth  2 (two) times daily. Patient not taking: Reported on 07/28/2018 01/27/14   Ivery Quale, PA-C  oxyCODONE-acetaminophen (PERCOCET) 5-325 MG per tablet Take 1 tablet by mouth every 6 (six) hours as needed for pain. Patient not taking: Reported on 01/27/2014 12/22/12   Bethann Berkshire, MD    Family History Family History  Problem Relation Age of Onset  . COPD Mother   . Heart disease Mother   . Diabetes Father     Social History Social History   Tobacco Use  . Smoking status: Current Every Day Smoker    Packs/day: 0.50    Types: Cigars  . Smokeless tobacco: Never Used  Substance Use Topics  . Alcohol use: Yes    Comment: denies  . Drug use: No      Allergies   Patient has no known allergies.   Review of Systems Review of Systems  Constitutional: Negative for chills, diaphoresis, fatigue and fever.  HENT: Negative for congestion, rhinorrhea, sinus pressure, sinus pain and sore throat.   Eyes: Negative for visual disturbance.  Respiratory: Positive for cough and chest tightness. Negative for shortness of breath and wheezing.   Cardiovascular: Positive for chest pain. Negative for palpitations.  Gastrointestinal: Negative for abdominal pain, constipation, diarrhea, nausea and vomiting.  Genitourinary: Negative for decreased urine volume, difficulty urinating, dysuria, hematuria and urgency.  Musculoskeletal: Positive for arthralgias, back pain and myalgias. Negative for neck pain and neck stiffness.  Skin: Negative for rash.  Neurological: Positive for weakness. Negative for dizziness, light-headedness and headaches.     Physical Exam Updated Vital Signs BP (!) 157/93   Pulse 79   Temp 98.1 F (36.7 C) (Oral)   Resp 18   SpO2 98%   Physical Exam Constitutional:      General: He is not in acute distress.    Appearance: He is well-developed. He is obese. He is not diaphoretic.  HENT:     Head: Normocephalic and atraumatic.  Cardiovascular:     Rate and Rhythm: Normal rate and regular rhythm.     Heart sounds: Normal heart sounds. No murmur. No friction rub. No gallop.   Pulmonary:     Effort: Pulmonary effort is normal. No tachypnea or respiratory distress.     Breath sounds: Normal breath sounds. No decreased breath sounds, wheezing, rhonchi or rales.  Chest:     Chest wall: Tenderness present.     Comments: ttp substernally Musculoskeletal:       Arms:  Skin:    General: Skin is warm and dry.  Neurological:     Mental Status: He is alert and oriented to person, place, and time.     Motor: No weakness.  Psychiatric:        Mood and Affect: Mood normal. Mood is not anxious.        Behavior: Behavior  normal.      ED Treatments / Results  Labs (all labs ordered are listed, but only abnormal results are displayed) Labs Reviewed  BASIC METABOLIC PANEL - Abnormal; Notable for the following components:      Result Value   Glucose, Bld 165 (*)    BUN 5 (*)    Calcium 8.4 (*)    All other components within normal limits  TROPONIN I - Abnormal; Notable for the following components:   Troponin I 0.04 (*)    All other components within normal limits  TROPONIN I - Abnormal; Notable for the following components:   Troponin I 0.04 (*)  All other components within normal limits  NOVEL CORONAVIRUS, NAA (HOSPITAL ORDER, SEND-OUT TO REF LAB)  CBC    EKG EKG Interpretation  Date/Time:  Tuesday Jul 28 2018 11:09:33 EDT Ventricular Rate:  80 PR Interval:    QRS Duration: 91 QT Interval:  412 QTC Calculation: 476 R Axis:   52 Text Interpretation:  Sinus rhythm Anteroseptal infarct, old Minimal ST depression, inferior leads Confirmed by Blane OharaZavitz, Joshua [phone]) on 07/28/2018 12:01:55 PM Also confirmed by Blane OharaZavitz, Joshua [phone])  on 07/28/2018 12:02:13 PM Also confirmed by Blane OharaZavitz, Joshua [phone]([id number])  on 07/28/2018 1:39:34 PM   Radiology Dg Chest 2 View  Result Date: 07/28/2018 CLINICAL DATA:  Chest pain EXAM: CHEST - 2 VIEW COMPARISON:  12/18/2004 FINDINGS: Heart is at the upper limits of normal in size. The lungs are well aerated bilaterally. No focal infiltrate or sizable effusion is seen. No acute bony abnormality is noted. IMPRESSION: No acute abnormality noted. Electronically Signed   By: Alcide CleverMark  Lukens M.D.   On: 07/28/2018 12:17    Procedures Procedures (including critical care time)  Medications Ordered in ED Medications - No data to display   Initial Impression / Assessment and Plan / ED Course  I have reviewed the triage vital signs and the nursing notes.  Pertinent labs & imaging results that were available during my care of the patient were reviewed by me and considered in my  medical decision making (see chart for details).  Patient is a 58 year old man presenting to the ED with atypical chest pain that radiates to the right side. Describes the pain as a tightness and dull ache that wraps around from his right chest to right side. Risk factors include smoking, HTN, and family history. Heart score 4. On exam he was severely tender to palpation on the right side. Intial troponin was 0.04. EKG reassuring, no ischemic changes. Chest x-ray shows no acute abnormalities; however, possible linear opacity appreciated on right lower lobe.    Patient's presentation is likely musculoskeletal as it is exacerbated by movement, coughing and palpation. Will check another troponin given borderline initial trop and order CTA for dissection. He has some risk factors including HTN, male sex and age. If troponin remains stable and CTA is normal he is stable for discharge home. Will prescribe a short term course of doxycycline given changes on chest xray and cough with close follow up with PCP. He was also tested for COVID-19, sent to an outpatient lab.     Final Clinical Impressions(s) / ED Diagnoses   Final diagnoses:  Nonspecific chest pain    ED Discharge Orders         Ordered    doxycycline (VIBRA-TABS) 100 MG tablet  2 times daily     07/28/18 1509           ,  N, DO 07/28/18 1458    ,  N, DO 07/28/18 1505    ,  N, DO 07/28/18 1512    Blane OharaZavitz, Joshua, MD 07/28/18 1528

## 2018-07-28 NOTE — ED Provider Notes (Signed)
3:30 PM-checkout from Dr. Jodi Mourning to evaluate after CT imaging and second troponin.  Patient with solution of symptoms, which has required a comprehensive evaluation.  Clinical Course as of Jul 28 1635  Tue Jul 28, 2018  1634 Mild elevation  Troponin I - ONCE - STAT(!!) [EW]  1634 Normal  CBC [EW]  1634 Normal except for slight, BUN low, calcium low  Basic metabolic panel(!) [EW]  1635 No infiltrate or CHF, images reviewed by me  DG Chest 2 View [EW]  1635 No PE, dissection, or pneumonia.  Images reviewed by me  CT Angio Chest/Abd/Pel for Dissection W and/or Wo Contrast [EW]    Clinical Course User Index [EW] Mancel Bale, MD    EKG Interpretation  Date/Time:  Tuesday Jul 28 2018 15:52:32 EDT Ventricular Rate:  76 PR Interval:    QRS Duration: 94 QT Interval:  427 QTC Calculation: 481 R Axis:   58 Text Interpretation:  Sinus rhythm Anteroseptal infarct, old Minimal ST depression, inferior leads since last tracing no significant change Confirmed by Mancel Bale [phone]) on 07/28/2018 4:04:07 PM      Dg Chest 2 View  Result Date: 07/28/2018 CLINICAL DATA:  Chest pain EXAM: CHEST - 2 VIEW COMPARISON:  12/18/2004 FINDINGS: Heart is at the upper limits of normal in size. The lungs are well aerated bilaterally. No focal infiltrate or sizable effusion is seen. No acute bony abnormality is noted. IMPRESSION: No acute abnormality noted. Electronically Signed   By: Alcide Clever M.D.   On: 07/28/2018 12:17   Ct Angio Chest/abd/pel For Dissection W And/or Wo Contrast  Result Date: 07/28/2018 CLINICAL DATA:  Chest and abdominal pain which radiates toward back EXAM: CT ANGIOGRAPHY CHEST, ABDOMEN AND PELVIS TECHNIQUE: Initially, axial CT images were obtained through the chest without intravenous contrast material administration. Multidetector CT imaging through the chest, abdomen and pelvis was performed using the standard protocol during bolus administration of intravenous contrast.  Multiplanar reconstructed images and MIPs were obtained and reviewed to evaluate the vascular anatomy. CONTRAST:  OMNIPAQUE IOHEXOL 350 MG/ML SOLN COMPARISON:  Chest radiograph Jul 28, 2018 FINDINGS: CTA CHEST FINDINGS Cardiovascular: There is no intramural hematoma within the thoracic aorta appreciable on noncontrast enhanced study. There is no appreciable thoracic aortic aneurysm or dissection. Visualized great vessels appear unremarkable. There are scattered foci of aortic atherosclerosis. There are scattered foci of coronary artery calcification. There is no appreciable pericardial effusion or pericardial thickening. There is no demonstrable pulmonary embolus. Mediastinum/Nodes: Thyroid appears unremarkable. There are subcentimeter axillary and scattered subcentimeter mediastinal lymph nodes. There is no evident thoracic adenopathy by size criteria. No esophageal lesions are demonstrable. Lungs/Pleura: There is no evident edema or consolidation. There is a focal opacity abutting the pleura in the posterior aspect of the superior segment of the right lower lobe measuring 8 x 4 mm. This opacity is best seen on axial slice 65 series 8. No pleural effusion or pleural thickening evident. Musculoskeletal: There is degenerative change in the thoracic spine that. There are no blastic or lytic bone lesions. No evident chest wall lesions. Review of the MIP images confirms the above findings. CTA ABDOMEN AND PELVIS FINDINGS VASCULAR Aorta: There is no demonstrable abdominal aortic aneurysm or dissection. There are scattered foci of calcification in the aorta without hemodynamically significant obstruction. Celiac: The celiac artery and its major branches are widely patent. No aneurysm or dissection involving these vessels. SMA: The superior mesenteric artery and its branches are widely patent. No aneurysm or dissection evident  involving these branches. Renals: There is a single renal artery on the right. There are 2  left renal arteries. There is no appreciable aneurysm or dissection involving the renal arteries or their branches. No fibromuscular dysplasia evident. Renal arterial vessels appear widely patent throughout their respective courses. IMA: The inferior mesenteric artery and its branches are widely patent. No aneurysm or dissection involving these vessels. Inflow: There is calcification in the distal common iliac artery on the right extending into the origin of the right hypogastric artery without hemodynamically significant obstruction. No hemodynamically significant obstruction is noted in the major pelvic arterial vessels. No aneurysm or dissection involving major pelvic arterial vessels evident. Note that the visualized proximal superficial femoral and profunda femoral arteries appear patent. There is slight calcification in the proximal superficial femoral arteries bilaterally without hemodynamically significant obstruction. Veins: No obvious venous abnormality within the limitations of this arterial phase study. Review of the MIP images confirms the above findings. NON-VASCULAR Hepatobiliary: There is hepatic steatosis. No focal liver lesions are demonstrable. Gallbladder wall is not appreciably thickened. There is no biliary duct dilatation. Pancreas: No pancreatic mass or inflammatory focus. Spleen: Splenic lesions are evident. Adrenals/Urinary Tract: Adrenals bilaterally appear unremarkable. 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 There is no evident hydronephrosis in either kidney. There is no appreciable renal or ureteral calculus on either side. The urinary bladder is midline with wall thickness within normal limits for degree of distention. Stomach/Bowel: There is no appreciable bowel wall or mesenteric thickening. No evident bowel obstruction. Terminal ileum appears unremarkable. No free air or portal venous air evident. Lymphatic: No adenopathy is evident in the  abdomen or pelvis. Reproductive: Prostate and seminal vesicles are normal in size and contour. No pelvic masses are evident. There is a small left scrotal hydrocele, incompletely visualized. Other: Appendix appears unremarkable. There is no abscess or ascites in the abdomen or pelvis. There is a small ventral hernia which contains a loop of small bowel but no bowel obstruction. This finding is best appreciated on axial slices 148 through 151, series 6 and sagittal slices 101 through 106, series 11. There is fat in each inguinal ring as well. Musculoskeletal: There is degenerative change in the lumbar spine with evidence of previous posterior fusion in the lower lumbar region. There is evidence of old trauma involving the lateral left ischium with sclerosis in this area. There is degenerative change in each sacroiliac joint. There are no destructive type bone lesions. There is no intramuscular lesion evident. Review of the MIP images confirms the above findings. IMPRESSION: CT angiogram chest: 1. No demonstrable thoracic aortic aneurysm or dissection. No pulmonary embolus evident. There is aortic atherosclerosis. There are foci of coronary artery calcification. 2. No lung edema or consolidation. 8 x 4 mm nodular opacity in the posterior aspect of the superior segment right lower lobe. Non-contrast chest CT at 6-12 months is recommended. If the nodule is stable at time of repeat CT, then future CT at 18-24 months (from today's scan) is considered optional for low-risk patients, but is recommended for high-risk patients. This recommendation follows the consensus statement: Guidelines for Management of Incidental Pulmonary Nodules Detected on CT Images: From the Fleischner Society 2017; Radiology 2017; 284:228-243. 3. No evident thoracic adenopathy. CT angiogram abdomen; CT angiogram pelvis: 1. No aneurysm or dissection involving the aorta as well as major mesenteric and pelvic arterial vessels. No evident fibromuscular  dysplasia. 2. There is aortic atherosclerosis. There is also  mild atherosclerosis in the distal right common iliac and proximal right hypogastric arteries. Slight atherosclerotic calcification noted in each proximal common femoral artery. No hemodynamically significant obstruction evident in the arterial vessels in the abdomen and pelvis. 3. Small ventral hernia which contains a loop of small bowel but no bowel compromise. 4. No evident bowel obstruction. No abscess in the abdomen or pelvis. Appendix region appears unremarkable. 5. No evident renal or ureteral calculus. No hydronephrosis. 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 6.  Hepatic steatosis. 7.  Small left scrotal hydrocele, incompletely visualized. Electronically Signed   By: Bretta Bang III M.D.   On: 07/28/2018 16:33     Patient Vitals for the past 24 hrs:  BP Temp Temp src Pulse Resp SpO2  07/28/18 1526 - 98.3 F (36.8 C) Oral - - -  07/28/18 1430 (!) 162/104 - - 81 - 100 %  07/28/18 1400 (!) 161/92 - - - - 96 %  07/28/18 1330 (!) 169/92 - - 76 - 99 %  07/28/18 1230 (!) 157/93 - - - 18 -  07/28/18 1200 (!) 157/98 - - - 20 -  07/28/18 1130 (!) 150/87 - - 79 19 98 %  07/28/18 1101 (!) 163/96 98.1 F (36.7 C) Oral 77 - 99 %    4:53 PM Reevaluation with update and discussion. After initial assessment and treatment, an updated evaluation reveals he is ambulating easily from the bathroom and is comfortable.  He states now that he injured his right side when he misstepped while walking with a walker several days ago.  The pain is been waxing and waning and worse with movement, since then.  He has no other complaints and states he is ready to go home at this time.  I discussed the findings including the CT abnormalities with him.  He understands that he will have to follow-up with his PCP.Marland Kitchen Mancel Bale   Medical Decision Making: Patient presenting with atypical for cardiac chest pain, right-sided and back.   Screening for aortic dissection and acute.  Mild troponin elevation, unchanged when rechecked.  CT imaging indicates nonspecific right-sided pulmonary nodule.  Recommend follow-up 6 to 12 months with dedicated CT chest imaging.  CRITICAL CARE-no Performed by: Mancel Bale   Nursing Notes Reviewed/ Care Coordinated Applicable Imaging Reviewed Interpretation of Laboratory Data incorporated into ED treatment  The patient appears reasonably screened and/or stabilized for discharge and I doubt any other medical condition or other [facility] requiring further screening, evaluation, or treatment in the ED at this time prior to discharge.  Plan: Home Medications-continue usual medication use Tylenol for pain.; Home Treatments-rest, eat, gradually advance activities; return here if the recommended treatment, does not improve the symptoms; Recommended follow up-PCP checkup 1 to 2 weeks and for CT imaging in 6 months regarding the pulmonary nodule.    Mancel Bale, MD 07/28/18 1655

## 2018-10-14 ENCOUNTER — Other Ambulatory Visit: Payer: Self-pay

## 2018-10-14 ENCOUNTER — Ambulatory Visit (HOSPITAL_COMMUNITY)
Admission: RE | Admit: 2018-10-14 | Discharge: 2018-10-14 | Disposition: A | Discharge status: Home / Self Care | Payer: Medicare Other | Source: Ambulatory Visit | Attending: Family Medicine | Admitting: Family Medicine

## 2018-10-14 ENCOUNTER — Other Ambulatory Visit (HOSPITAL_COMMUNITY): Payer: Self-pay | Admitting: Family Medicine

## 2018-10-14 DIAGNOSIS — R52 Pain, unspecified: Secondary | ICD-10-CM

## 2018-12-18 ENCOUNTER — Encounter (HOSPITAL_COMMUNITY)
Admission: RE | Admit: 2018-12-18 | Discharge: 2018-12-18 | Disposition: A | Discharge status: Home / Self Care | Payer: Medicare Other | Source: Ambulatory Visit | Attending: Physician Assistant | Admitting: Physician Assistant

## 2018-12-18 ENCOUNTER — Other Ambulatory Visit: Payer: Self-pay

## 2018-12-18 DIAGNOSIS — M459 Ankylosing spondylitis of unspecified sites in spine: Secondary | ICD-10-CM | POA: Insufficient documentation

## 2018-12-18 MED ORDER — SODIUM CHLORIDE 0.9 % IV SOLN
Freq: Once | INTRAVENOUS | Status: AC
  Administered 2018-12-18: 10:00:00 via INTRAVENOUS

## 2018-12-18 MED ORDER — SODIUM CHLORIDE 0.9 % IV SOLN
5.0000 mg/kg | Freq: Once | INTRAVENOUS | Status: AC
  Administered 2018-12-18: 500 mg via INTRAVENOUS
  Filled 2018-12-18: qty 50

## 2018-12-18 MED ORDER — LORATADINE 10 MG PO TABS
10.0000 mg | ORAL_TABLET | Freq: Once | ORAL | Status: AC
  Administered 2018-12-18: 10 mg via ORAL
  Filled 2018-12-18: qty 1

## 2018-12-18 MED ORDER — ACETAMINOPHEN 500 MG PO TABS
ORAL_TABLET | ORAL | Status: AC
  Filled 2018-12-18: qty 2

## 2018-12-18 MED ORDER — ACETAMINOPHEN 500 MG PO TABS
1000.0000 mg | ORAL_TABLET | Freq: Once | ORAL | Status: AC
  Administered 2018-12-18: 1000 mg via ORAL

## 2018-12-18 NOTE — Progress Notes (Signed)
1322-remicade infusion completed at 1238. VSS. Denies shortness of breath, rash, N/V, headache, chest pain. Tolerated remicade infusion well. Voices no c/o at this time. Wants to go home. D/C to home in good condition.

## 2019-01-01 ENCOUNTER — Encounter (HOSPITAL_COMMUNITY)
Admission: RE | Admit: 2019-01-01 | Discharge: 2019-01-01 | Disposition: A | Discharge status: Home / Self Care | Payer: Medicare Other | Source: Ambulatory Visit | Attending: Physician Assistant | Admitting: Physician Assistant

## 2019-01-01 ENCOUNTER — Other Ambulatory Visit: Payer: Self-pay

## 2019-01-01 DIAGNOSIS — M459 Ankylosing spondylitis of unspecified sites in spine: Secondary | ICD-10-CM | POA: Diagnosis not present

## 2019-01-01 MED ORDER — ACETAMINOPHEN 500 MG PO TABS
ORAL_TABLET | ORAL | Status: AC
  Filled 2019-01-01: qty 2

## 2019-01-01 MED ORDER — ACETAMINOPHEN 500 MG PO TABS
1000.0000 mg | ORAL_TABLET | Freq: Once | ORAL | Status: AC
  Administered 2019-01-01: 1000 mg via ORAL

## 2019-01-01 MED ORDER — SODIUM CHLORIDE 0.9 % IV SOLN
5.0000 mg/kg | Freq: Once | INTRAVENOUS | Status: AC
  Administered 2019-01-01: 10:00:00 500 mg via INTRAVENOUS
  Filled 2019-01-01: qty 50

## 2019-01-01 MED ORDER — LORATADINE 10 MG PO TABS
ORAL_TABLET | ORAL | Status: AC
  Filled 2019-01-01: qty 1

## 2019-01-01 MED ORDER — LORATADINE 10 MG PO TABS
10.0000 mg | ORAL_TABLET | Freq: Once | ORAL | Status: AC
  Administered 2019-01-01: 10 mg via ORAL

## 2019-01-01 MED ORDER — SODIUM CHLORIDE 0.9 % IV SOLN
Freq: Once | INTRAVENOUS | Status: AC
  Administered 2019-01-01: 09:00:00 via INTRAVENOUS

## 2019-01-29 ENCOUNTER — Encounter (HOSPITAL_COMMUNITY)
Admission: RE | Admit: 2019-01-29 | Discharge: 2019-01-29 | Disposition: A | Discharge status: Home / Self Care | Payer: Medicare Other | Source: Ambulatory Visit | Attending: Physician Assistant | Admitting: Physician Assistant

## 2019-01-29 ENCOUNTER — Other Ambulatory Visit: Payer: Self-pay

## 2019-01-29 ENCOUNTER — Encounter (HOSPITAL_COMMUNITY): Payer: Self-pay

## 2019-01-29 DIAGNOSIS — M459 Ankylosing spondylitis of unspecified sites in spine: Secondary | ICD-10-CM | POA: Diagnosis present

## 2019-01-29 MED ORDER — ACETAMINOPHEN 500 MG PO TABS
1000.0000 mg | ORAL_TABLET | Freq: Once | ORAL | Status: AC
  Administered 2019-01-29: 09:00:00 1000 mg via ORAL

## 2019-01-29 MED ORDER — SODIUM CHLORIDE 0.9 % IV SOLN
5.0000 mg/kg | Freq: Once | INTRAVENOUS | Status: AC
  Administered 2019-01-29: 10:00:00 500 mg via INTRAVENOUS
  Filled 2019-01-29: qty 50

## 2019-01-29 MED ORDER — SODIUM CHLORIDE 0.9 % IV SOLN
Freq: Once | INTRAVENOUS | Status: AC
  Administered 2019-01-29: 09:00:00 via INTRAVENOUS

## 2019-01-29 MED ORDER — LORATADINE 10 MG PO TABS
10.0000 mg | ORAL_TABLET | Freq: Once | ORAL | Status: AC
  Administered 2019-01-29: 10 mg via ORAL

## 2019-01-29 MED ORDER — ACETAMINOPHEN 500 MG PO TABS
ORAL_TABLET | ORAL | Status: AC
  Filled 2019-01-29: qty 2

## 2019-03-15 ENCOUNTER — Other Ambulatory Visit: Payer: Self-pay

## 2019-03-15 ENCOUNTER — Encounter (HOSPITAL_COMMUNITY)
Admission: RE | Admit: 2019-03-15 | Discharge: 2019-03-15 | Disposition: A | Discharge status: Home / Self Care | Payer: Medicare Other | Source: Ambulatory Visit | Attending: Physician Assistant | Admitting: Physician Assistant

## 2019-03-15 ENCOUNTER — Encounter (HOSPITAL_COMMUNITY): Payer: Self-pay

## 2019-03-15 DIAGNOSIS — M459 Ankylosing spondylitis of unspecified sites in spine: Secondary | ICD-10-CM | POA: Diagnosis present

## 2019-03-15 MED ORDER — DIPHENHYDRAMINE HCL 50 MG/ML IJ SOLN
INTRAMUSCULAR | Status: AC
  Filled 2019-03-15: qty 1

## 2019-03-15 MED ORDER — LORATADINE 10 MG PO TABS
10.0000 mg | ORAL_TABLET | Freq: Once | ORAL | Status: AC
  Administered 2019-03-15: 10 mg via ORAL
  Filled 2019-03-15: qty 1

## 2019-03-15 MED ORDER — SODIUM CHLORIDE 0.9 % IV SOLN: Freq: Once | INTRAVENOUS | Status: AC

## 2019-03-15 MED ORDER — ONDANSETRON 4 MG PO TBDP
ORAL_TABLET | ORAL | Status: AC
  Filled 2019-03-15: qty 1

## 2019-03-15 MED ORDER — ACETAMINOPHEN 500 MG PO TABS
1000.0000 mg | ORAL_TABLET | Freq: Once | ORAL | Status: AC
  Administered 2019-03-15: 09:00:00 1000 mg via ORAL
  Filled 2019-03-15: qty 2

## 2019-03-15 MED ORDER — SODIUM CHLORIDE 0.9 % IV SOLN
5.0000 mg/kg | Freq: Once | INTRAVENOUS | Status: AC
  Administered 2019-03-15: 10:00:00 500 mg via INTRAVENOUS
  Filled 2019-03-15: qty 50

## 2019-03-15 MED ORDER — ONDANSETRON 4 MG PO TBDP
4.0000 mg | ORAL_TABLET | Freq: Once | ORAL | Status: AC
  Administered 2019-03-15: 4 mg via ORAL

## 2019-03-15 MED ORDER — DIPHENHYDRAMINE HCL 50 MG/ML IJ SOLN
25.0000 mg | Freq: Once | INTRAMUSCULAR | Status: AC
  Administered 2019-03-15: 25 mg via INTRAVENOUS

## 2019-03-15 NOTE — Progress Notes (Signed)
Patient was receiving infusion of remicade and the rate was on 150. Patient suddenly became nauseous and was itching from head to toe. Infusion was stopped and doctor contacted. Received order for benadryl and Zofran. Patient took medication and within 10 minutes was getting relief of itching and nausea. Monitored patient for one hour before discharging him. Patient discharged to home in stable condition.

## 2019-03-16 NOTE — Progress Notes (Signed)
Per Beaulah Dinning, with Marrianne Mood at [facility], patient will be pulled from Remicade and other options discussed with him at follow up visit with [facility].  Patient has been notified by office.

## 2019-06-14 ENCOUNTER — Ambulatory Visit: Payer: Medicare Other | Admitting: Family Medicine

## 2019-06-23 ENCOUNTER — Other Ambulatory Visit (HOSPITAL_COMMUNITY): Payer: Self-pay | Admitting: Family Medicine

## 2019-06-23 DIAGNOSIS — M25561 Pain in right knee: Secondary | ICD-10-CM

## 2020-12-04 IMAGING — DX RIGHT LITTLE FINGER 2+V
3 series · 3 of 3 positions shown · non-contrast
Comparison: None.

CLINICAL DATA: Chronic fracture and deformity.  Pain and throbbing.

EXAM:
RIGHT LITTLE FINGER 2+V

[finger ap]
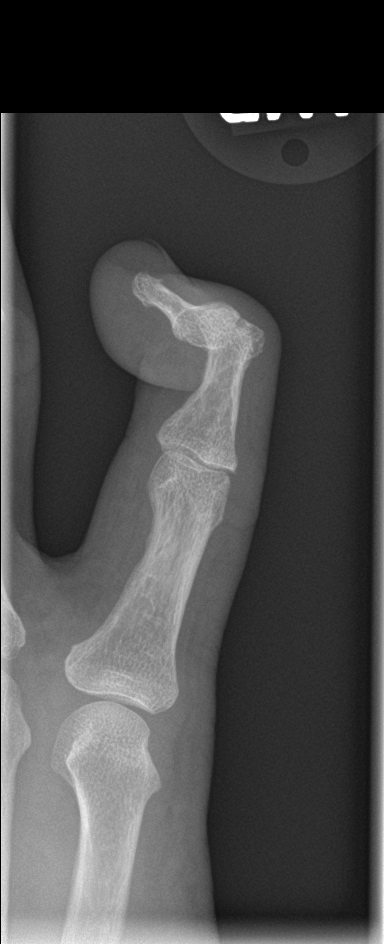

[finger obl]
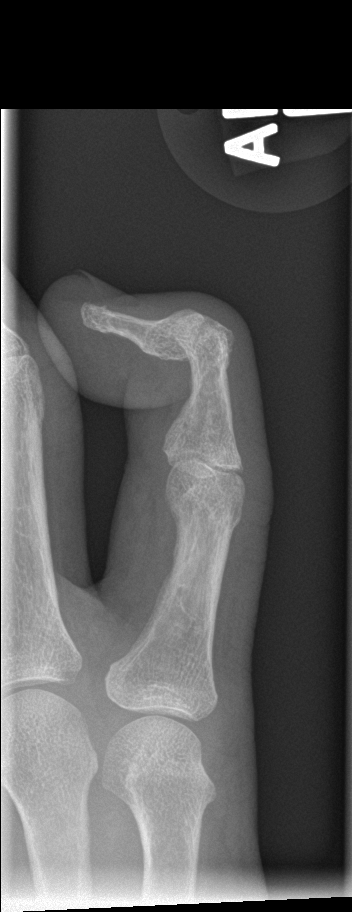

[finger lat]
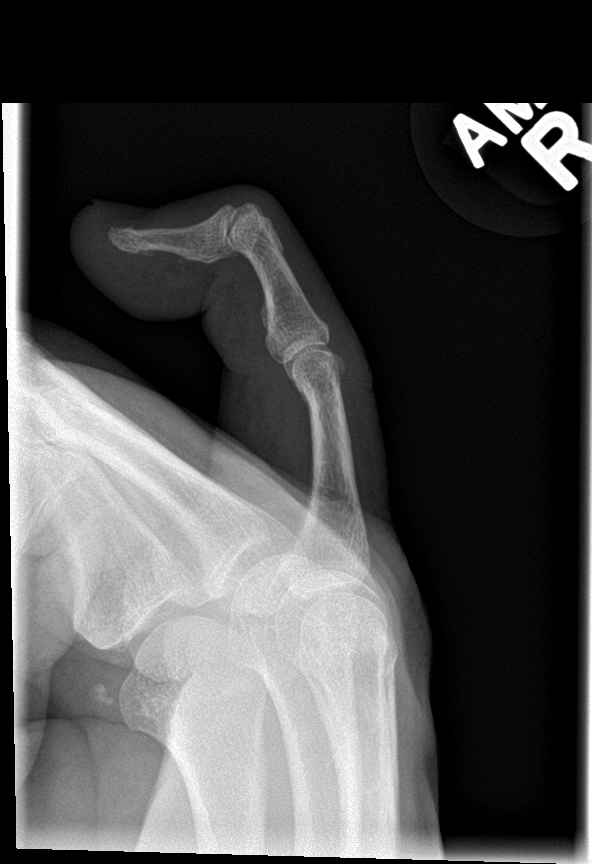

[3 of 3 positions shown; findings below may reference images not displayed]

FINDINGS: Chronic flexion deformity of the DIP joint. Generalized soft tissue
swelling of the finger, without evidence of acute fracture,
dislocation or destructive change.
IMPRESSION: Soft tissue swelling. Presumably chronic flexion deformity of the
DIP joint given the history. No sign of acute injury or
osteomyelitis.
